# Patient Record
Sex: Female | Born: 1950 | Race: Black or African American | Hispanic: No | Marital: Married | Smoking: Never smoker
Health system: Southern US, Community
[De-identification: ages and names within clinical notes are randomized; demographics above are authoritative.]

## PROBLEM LIST (undated history)

## (undated) DIAGNOSIS — I10 Essential (primary) hypertension: Secondary | ICD-10-CM

## (undated) HISTORY — DX: Essential (primary) hypertension: I10

---

## 1997-08-02 ENCOUNTER — Emergency Department (HOSPITAL_COMMUNITY): Admission: EM | Admit: 1997-08-02 | Discharge: 1997-08-02 | Payer: Self-pay | Admitting: Emergency Medicine

## 1997-09-01 ENCOUNTER — Emergency Department (HOSPITAL_COMMUNITY): Admission: EM | Admit: 1997-09-01 | Discharge: 1997-09-01 | Payer: Self-pay | Admitting: Emergency Medicine

## 1997-09-05 ENCOUNTER — Encounter: Admission: RE | Admit: 1997-09-05 | Discharge: 1997-09-05 | Payer: Self-pay | Admitting: Family Medicine

## 1997-09-09 ENCOUNTER — Emergency Department (HOSPITAL_COMMUNITY): Admission: EM | Admit: 1997-09-09 | Discharge: 1997-09-09 | Payer: Self-pay | Admitting: Emergency Medicine

## 1997-09-14 ENCOUNTER — Encounter: Admission: RE | Admit: 1997-09-14 | Discharge: 1997-09-14 | Payer: Self-pay | Admitting: Family Medicine

## 1997-10-03 ENCOUNTER — Encounter: Admission: RE | Admit: 1997-10-03 | Discharge: 1997-10-03 | Payer: Self-pay | Admitting: Family Medicine

## 1997-10-15 ENCOUNTER — Encounter: Admission: RE | Admit: 1997-10-15 | Discharge: 1997-10-15 | Payer: Self-pay | Admitting: Family Medicine

## 1997-10-18 ENCOUNTER — Encounter (INDEPENDENT_AMBULATORY_CARE_PROVIDER_SITE_OTHER): Payer: Self-pay | Admitting: *Deleted

## 1997-11-07 ENCOUNTER — Encounter: Admission: RE | Admit: 1997-11-07 | Discharge: 1997-11-07 | Payer: Self-pay | Admitting: Family Medicine

## 1998-01-22 ENCOUNTER — Encounter: Admission: RE | Admit: 1998-01-22 | Discharge: 1998-01-22 | Payer: Self-pay | Admitting: Family Medicine

## 1998-03-21 ENCOUNTER — Encounter: Admission: RE | Admit: 1998-03-21 | Discharge: 1998-03-21 | Payer: Self-pay | Admitting: Family Medicine

## 1998-04-08 ENCOUNTER — Encounter: Admission: RE | Admit: 1998-04-08 | Discharge: 1998-04-08 | Payer: Self-pay | Admitting: Sports Medicine

## 1998-04-17 ENCOUNTER — Encounter: Admission: RE | Admit: 1998-04-17 | Discharge: 1998-04-17 | Payer: Self-pay | Admitting: Sports Medicine

## 1998-04-26 ENCOUNTER — Encounter: Admission: RE | Admit: 1998-04-26 | Discharge: 1998-04-26 | Payer: Self-pay | Admitting: Sports Medicine

## 1999-08-02 ENCOUNTER — Emergency Department (HOSPITAL_COMMUNITY): Admission: EM | Admit: 1999-08-02 | Discharge: 1999-08-02 | Payer: Self-pay | Admitting: Emergency Medicine

## 2003-05-15 ENCOUNTER — Emergency Department (HOSPITAL_COMMUNITY): Admission: EM | Admit: 2003-05-15 | Discharge: 2003-05-15 | Payer: Self-pay | Admitting: *Deleted

## 2004-02-27 ENCOUNTER — Other Ambulatory Visit: Admission: RE | Admit: 2004-02-27 | Discharge: 2004-02-27 | Payer: Self-pay | Admitting: Family Medicine

## 2004-05-24 ENCOUNTER — Emergency Department (HOSPITAL_COMMUNITY): Admission: EM | Admit: 2004-05-24 | Discharge: 2004-05-24 | Payer: Self-pay | Admitting: Emergency Medicine

## 2006-06-18 ENCOUNTER — Encounter (INDEPENDENT_AMBULATORY_CARE_PROVIDER_SITE_OTHER): Payer: Self-pay | Admitting: *Deleted

## 2008-09-20 ENCOUNTER — Emergency Department (HOSPITAL_COMMUNITY): Admission: EM | Admit: 2008-09-20 | Discharge: 2008-09-20 | Payer: Self-pay | Admitting: Emergency Medicine

## 2010-07-28 LAB — DIFFERENTIAL
Basophils Absolute: 0 10*3/uL (ref 0.0–0.1)
Basophils Relative: 1 % (ref 0–1)
Eosinophils Absolute: 0.1 10*3/uL (ref 0.0–0.7)
Eosinophils Relative: 2 % (ref 0–5)
Lymphocytes Relative: 34 % (ref 12–46)
Lymphs Abs: 1.8 10*3/uL (ref 0.7–4.0)
Monocytes Absolute: 0.4 10*3/uL (ref 0.1–1.0)
Monocytes Relative: 8 % (ref 3–12)
Neutro Abs: 2.9 10*3/uL (ref 1.7–7.7)
Neutrophils Relative %: 55 % (ref 43–77)

## 2010-07-28 LAB — COMPREHENSIVE METABOLIC PANEL
ALT: 15 U/L (ref 0–35)
AST: 21 U/L (ref 0–37)
Albumin: 3.7 g/dL (ref 3.5–5.2)
Alkaline Phosphatase: 94 U/L (ref 39–117)
BUN: 12 mg/dL (ref 6–23)
CO2: 31 mEq/L (ref 19–32)
Calcium: 10 mg/dL (ref 8.4–10.5)
Chloride: 102 mEq/L (ref 96–112)
Creatinine, Ser: 0.89 mg/dL (ref 0.4–1.2)
GFR calc Af Amer: >60 mL/min (ref >60)
GFR calc non Af Amer: >60 mL/min (ref >60)
Glucose, Bld: 109 mg/dL — ABNORMAL HIGH (ref 70–99)
Potassium: 3.1 mEq/L — ABNORMAL LOW (ref 3.5–5.1)
Sodium: 137 mEq/L (ref 135–145)
Total Bilirubin: 0.6 mg/dL (ref 0.3–1.2)
Total Protein: 8.4 g/dL — ABNORMAL HIGH (ref 6.0–8.3)

## 2010-07-28 LAB — CBC
HCT: 35 % — ABNORMAL LOW (ref 36.0–46.0)
Hemoglobin: 11.6 g/dL — ABNORMAL LOW (ref 12.0–15.0)
MCHC: 33.1 g/dL (ref 30.0–36.0)
MCV: 81.3 fL (ref 78.0–100.0)
Platelets: 211 10*3/uL (ref 150–400)
RBC: 4.31 MIL/uL (ref 3.87–5.11)
RDW: 14 % (ref 11.5–15.5)
WBC: 5.3 10*3/uL (ref 4.0–10.5)

## 2010-07-28 LAB — URINALYSIS, ROUTINE W REFLEX MICROSCOPIC
Bilirubin Urine: NEGATIVE
Glucose, UA: NEGATIVE mg/dL
Hgb urine dipstick: NEGATIVE
Ketones, ur: NEGATIVE mg/dL
Nitrite: NEGATIVE
Protein, ur: NEGATIVE mg/dL
Specific Gravity, Urine: 1.018 (ref 1.005–1.030)
Urobilinogen, UA: 0.2 mg/dL (ref 0.0–1.0)
pH: 5.5 (ref 5.0–8.0)

## 2010-07-28 LAB — URINE MICROSCOPIC-ADD ON

## 2013-03-13 ENCOUNTER — Other Ambulatory Visit (HOSPITAL_COMMUNITY)
Admission: RE | Admit: 2013-03-13 | Discharge: 2013-03-13 | Disposition: A | Discharge status: Home / Self Care | Payer: Self-pay | Source: Ambulatory Visit | Attending: Family Medicine | Admitting: Family Medicine

## 2013-03-13 ENCOUNTER — Other Ambulatory Visit: Payer: Self-pay | Admitting: Family Medicine

## 2013-03-13 DIAGNOSIS — Z124 Encounter for screening for malignant neoplasm of cervix: Secondary | ICD-10-CM | POA: Insufficient documentation

## 2015-08-07 ENCOUNTER — Other Ambulatory Visit: Payer: Self-pay

## 2015-08-07 DIAGNOSIS — E7800 Pure hypercholesterolemia, unspecified: Secondary | ICD-10-CM | POA: Diagnosis not present

## 2015-08-07 DIAGNOSIS — Z0000 Encounter for general adult medical examination without abnormal findings: Secondary | ICD-10-CM | POA: Diagnosis not present

## 2015-08-07 DIAGNOSIS — B351 Tinea unguium: Secondary | ICD-10-CM | POA: Diagnosis not present

## 2015-08-07 DIAGNOSIS — D509 Iron deficiency anemia, unspecified: Secondary | ICD-10-CM | POA: Diagnosis not present

## 2015-08-07 DIAGNOSIS — Z01419 Encounter for gynecological examination (general) (routine) without abnormal findings: Secondary | ICD-10-CM | POA: Diagnosis not present

## 2015-08-07 DIAGNOSIS — Z23 Encounter for immunization: Secondary | ICD-10-CM | POA: Diagnosis not present

## 2015-08-07 DIAGNOSIS — H539 Unspecified visual disturbance: Secondary | ICD-10-CM | POA: Diagnosis not present

## 2015-08-07 DIAGNOSIS — I10 Essential (primary) hypertension: Secondary | ICD-10-CM | POA: Diagnosis not present

## 2015-08-07 DIAGNOSIS — Z1231 Encounter for screening mammogram for malignant neoplasm of breast: Secondary | ICD-10-CM

## 2015-08-21 ENCOUNTER — Ambulatory Visit
Admission: RE | Admit: 2015-08-21 | Discharge: 2015-08-21 | Disposition: A | Discharge status: Home / Self Care | Payer: Medicare Other | Source: Ambulatory Visit

## 2015-08-21 DIAGNOSIS — Z1231 Encounter for screening mammogram for malignant neoplasm of breast: Secondary | ICD-10-CM | POA: Diagnosis not present

## 2015-08-28 ENCOUNTER — Ambulatory Visit (INDEPENDENT_AMBULATORY_CARE_PROVIDER_SITE_OTHER): Payer: Medicare Other | Admitting: Podiatry

## 2015-08-28 ENCOUNTER — Encounter: Payer: Self-pay | Admitting: Podiatry

## 2015-08-28 DIAGNOSIS — M79675 Pain in left toe(s): Secondary | ICD-10-CM | POA: Diagnosis not present

## 2015-08-28 DIAGNOSIS — B351 Tinea unguium: Secondary | ICD-10-CM | POA: Diagnosis not present

## 2015-08-28 DIAGNOSIS — M79674 Pain in right toe(s): Secondary | ICD-10-CM | POA: Diagnosis not present

## 2015-08-28 NOTE — Progress Notes (Signed)
   Subjective:    Patient ID: Pamela Conner, female    DOB: 1950/05/11, 65 y.o.   MRN: [id number]  HPI    This patient presents today complaining of thickened and elongated and discolored toenails on the right and left feet with particular concern about the great toenails. She describes the symptoms occurring over a long period of time with gradual increase in thickness and color changes. As a result of the deformity in these toenails and some complaint of discomfort when walking wearing shoes she is requesting toenail debridement today. She denies any recent podiatric care or professional care for this problem     Review of Systems  Skin: Positive for color change.       Objective:   Physical Exam  Orientated 3  Vascular: No peripheral edema bilaterally DP pulses 2/4 bilaterally PT pulses 2/4 bilaterally Capillary reflex immediate bilaterally  Neurological: Sensation to 10 g monofilament wire intact 4/5 bilaterally Vibratory sensation reactive bilaterally Ankle reflex equal and reactive bilaterally  Dermatological: Open skin lesions bilaterally The toenails are elongated, discolored, hypertrophic with maximum texture and color changes in the hallux toenails bilaterally and palpable tenderness in toenails 6-10  Musculoskeletal: No deformities noted bilaterally There is no restriction ankle, subtalar, midtarsal joints bilaterally      Assessment & Plan:   Assessment: 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 Patient opting for debridement. The toenails 6-10 were debrided mechanically and electrically without any bleeding  Reappoint at patient's request

## 2015-08-28 NOTE — Patient Instructions (Signed)
Your foot examination demonstrated adequate feeling in pulsations in your feet. Today I trim the toenails most likely associated with fungal infection. Return as needed or every 3-4 months

## 2015-09-04 DIAGNOSIS — M8589 Other specified disorders of bone density and structure, multiple sites: Secondary | ICD-10-CM | POA: Diagnosis not present

## 2015-09-04 DIAGNOSIS — Z780 Asymptomatic menopausal state: Secondary | ICD-10-CM | POA: Diagnosis not present

## 2016-02-05 DIAGNOSIS — R7303 Prediabetes: Secondary | ICD-10-CM | POA: Diagnosis not present

## 2016-02-05 DIAGNOSIS — I10 Essential (primary) hypertension: Secondary | ICD-10-CM | POA: Diagnosis not present

## 2016-02-05 DIAGNOSIS — E7800 Pure hypercholesterolemia, unspecified: Secondary | ICD-10-CM | POA: Diagnosis not present

## 2016-11-03 DIAGNOSIS — I10 Essential (primary) hypertension: Secondary | ICD-10-CM | POA: Diagnosis not present

## 2016-11-03 DIAGNOSIS — Z23 Encounter for immunization: Secondary | ICD-10-CM | POA: Diagnosis not present

## 2016-11-03 DIAGNOSIS — Z0000 Encounter for general adult medical examination without abnormal findings: Secondary | ICD-10-CM | POA: Diagnosis not present

## 2016-11-03 DIAGNOSIS — Z1389 Encounter for screening for other disorder: Secondary | ICD-10-CM | POA: Diagnosis not present

## 2016-11-03 DIAGNOSIS — D509 Iron deficiency anemia, unspecified: Secondary | ICD-10-CM | POA: Diagnosis not present

## 2016-11-03 DIAGNOSIS — E7800 Pure hypercholesterolemia, unspecified: Secondary | ICD-10-CM | POA: Diagnosis not present

## 2016-11-03 DIAGNOSIS — R7303 Prediabetes: Secondary | ICD-10-CM | POA: Diagnosis not present

## 2017-12-01 DIAGNOSIS — I10 Essential (primary) hypertension: Secondary | ICD-10-CM | POA: Diagnosis not present

## 2017-12-01 DIAGNOSIS — E7800 Pure hypercholesterolemia, unspecified: Secondary | ICD-10-CM | POA: Diagnosis not present

## 2017-12-01 DIAGNOSIS — E669 Obesity, unspecified: Secondary | ICD-10-CM | POA: Diagnosis not present

## 2017-12-01 DIAGNOSIS — Z1211 Encounter for screening for malignant neoplasm of colon: Secondary | ICD-10-CM | POA: Diagnosis not present

## 2017-12-01 DIAGNOSIS — R7309 Other abnormal glucose: Secondary | ICD-10-CM | POA: Diagnosis not present

## 2018-01-18 ENCOUNTER — Encounter: Payer: Self-pay | Admitting: Podiatry

## 2018-01-18 ENCOUNTER — Ambulatory Visit (INDEPENDENT_AMBULATORY_CARE_PROVIDER_SITE_OTHER): Payer: Medicare Other | Admitting: Podiatry

## 2018-01-18 DIAGNOSIS — M79674 Pain in right toe(s): Secondary | ICD-10-CM | POA: Diagnosis not present

## 2018-01-18 DIAGNOSIS — B351 Tinea unguium: Secondary | ICD-10-CM | POA: Diagnosis not present

## 2018-01-18 DIAGNOSIS — M79675 Pain in left toe(s): Secondary | ICD-10-CM | POA: Diagnosis not present

## 2018-01-18 NOTE — Progress Notes (Signed)
Complaint:  Visit Type: Patient returns to my office for continued preventative foot care services. Complaint: Patient states" my nails have grown long and thick and become painful to walk and wear shoes"  The patient presents for preventative foot care services. No changes to ROS  Podiatric Exam: Vascular: dorsalis pedis and posterior tibial pulses are palpable bilateral. Capillary return is immediate. Temperature gradient is WNL. Skin turgor WNL  Sensorium: Normal Semmes Weinstein monofilament test. Normal tactile sensation bilaterally. Nail Exam: Pt has thick disfigured discolored nails with subungual debris noted bilateral entire nail hallux through fifth toenails Ulcer Exam: There is no evidence of ulcer or pre-ulcerative changes or infection. Orthopedic Exam: Muscle tone and strength are WNL. No limitations in general ROM. No crepitus or effusions noted. Foot type and digits show no abnormalities. Bony prominences are unremarkable. Skin: No Porokeratosis. No infection or ulcers  Diagnosis:  Onychomycosis, , Pain in right toe, pain in left toes  Treatment & Plan Procedures and Treatment: Consent by patient was obtained for treatment procedures.   Debridement of mycotic and hypertrophic toenails, 1 through 5 bilateral and clearing of subungual debris. No ulceration, no infection noted.  Return Visit-Office Procedure: Patient instructed to return to the office for a follow up visit prn  for continued evaluation and treatment.    Arnell Slivinski DPM 

## 2018-03-20 ENCOUNTER — Encounter: Payer: Self-pay | Admitting: *Deleted

## 2018-08-22 NOTE — [facility]regational Nurse Program (Signed)
[id number]/tct-patient see note for information

## 2018-09-27 ENCOUNTER — Encounter: Payer: Self-pay | Admitting: Podiatry

## 2018-09-27 ENCOUNTER — Ambulatory Visit (INDEPENDENT_AMBULATORY_CARE_PROVIDER_SITE_OTHER): Payer: Medicare Other | Admitting: Podiatry

## 2018-09-27 ENCOUNTER — Other Ambulatory Visit: Payer: Self-pay

## 2018-09-27 VITALS — Temp 97.9°F

## 2018-09-27 DIAGNOSIS — M79674 Pain in right toe(s): Secondary | ICD-10-CM

## 2018-09-27 DIAGNOSIS — M79675 Pain in left toe(s): Secondary | ICD-10-CM | POA: Diagnosis not present

## 2018-09-27 DIAGNOSIS — B351 Tinea unguium: Secondary | ICD-10-CM | POA: Diagnosis not present

## 2018-09-27 NOTE — Progress Notes (Signed)
Complaint:  Visit Type: Patient returns to my office for continued preventative foot care services. Complaint: Patient states" my nails have grown long and thick and become painful to walk and wear shoes"  The patient presents for preventative foot care services. No changes to ROS  Podiatric Exam: Vascular: dorsalis pedis and posterior tibial pulses are palpable bilateral. Capillary return is immediate. Temperature gradient is WNL. Skin turgor WNL  Sensorium: Normal Semmes Weinstein monofilament test. Normal tactile sensation bilaterally. Nail Exam: Pt has thick disfigured discolored nails with subungual debris noted bilateral entire nail hallux through fifth toenails Ulcer Exam: There is no evidence of ulcer or pre-ulcerative changes or infection. Orthopedic Exam: Muscle tone and strength are WNL. No limitations in general ROM. No crepitus or effusions noted. Foot type and digits show no abnormalities. Bony prominences are unremarkable. Skin: No Porokeratosis. No infection or ulcers  Diagnosis:  Onychomycosis, , Pain in right toe, pain in left toes  Treatment & Plan Procedures and Treatment: Consent by patient was obtained for treatment procedures.   Debridement of mycotic and hypertrophic toenails, 1 through 5 bilateral and clearing of subungual debris. No ulceration, no infection noted.  Return Visit-Office Procedure: Patient instructed to return to the office for a follow up visit prn  for continued evaluation and treatment.    Shaylynn Nulty DPM 

## 2019-01-31 ENCOUNTER — Ambulatory Visit (INDEPENDENT_AMBULATORY_CARE_PROVIDER_SITE_OTHER): Payer: Medicare Other | Admitting: Podiatry

## 2019-01-31 ENCOUNTER — Encounter: Payer: Self-pay | Admitting: Podiatry

## 2019-01-31 ENCOUNTER — Other Ambulatory Visit: Payer: Self-pay

## 2019-01-31 DIAGNOSIS — B351 Tinea unguium: Secondary | ICD-10-CM

## 2019-01-31 DIAGNOSIS — M79675 Pain in left toe(s): Secondary | ICD-10-CM

## 2019-01-31 DIAGNOSIS — M79674 Pain in right toe(s): Secondary | ICD-10-CM | POA: Diagnosis not present

## 2019-01-31 NOTE — Progress Notes (Signed)
Complaint:  Visit Type: Patient returns to my office for continued preventative foot care services. Complaint: Patient states" my nails have grown long and thick and become painful to walk and wear shoes"  The patient presents for preventative foot care services. No changes to ROS  Podiatric Exam: Vascular: dorsalis pedis and posterior tibial pulses are palpable bilateral. Capillary return is immediate. Temperature gradient is WNL. Skin turgor WNL  Sensorium: Normal Semmes Weinstein monofilament test. Normal tactile sensation bilaterally. Nail Exam: Pt has thick disfigured discolored nails with subungual debris noted bilateral entire nail hallux through fifth toenails Ulcer Exam: There is no evidence of ulcer or pre-ulcerative changes or infection. Orthopedic Exam: Muscle tone and strength are WNL. No limitations in general ROM. No crepitus or effusions noted. Foot type and digits show no abnormalities. Bony prominences are unremarkable. Skin: No Porokeratosis. No infection or ulcers  Diagnosis:  Onychomycosis, , Pain in right toe, pain in left toes  Treatment & Plan Procedures and Treatment: Consent by patient was obtained for treatment procedures.   Debridement of mycotic and hypertrophic toenails, 1 through 5 bilateral and clearing of subungual debris. No ulceration, no infection noted.  Return Visit-Office Procedure: Patient instructed to return to the office for a follow up visit 4 months for continued evaluation and treatment.    Cacey Willow DPM 

## 2019-02-15 ENCOUNTER — Other Ambulatory Visit: Payer: Self-pay | Admitting: Family Medicine

## 2019-02-15 DIAGNOSIS — D509 Iron deficiency anemia, unspecified: Secondary | ICD-10-CM | POA: Diagnosis not present

## 2019-02-15 DIAGNOSIS — R7303 Prediabetes: Secondary | ICD-10-CM | POA: Diagnosis not present

## 2019-02-15 DIAGNOSIS — Z1231 Encounter for screening mammogram for malignant neoplasm of breast: Secondary | ICD-10-CM

## 2019-02-15 DIAGNOSIS — I10 Essential (primary) hypertension: Secondary | ICD-10-CM | POA: Diagnosis not present

## 2019-02-15 DIAGNOSIS — E7800 Pure hypercholesterolemia, unspecified: Secondary | ICD-10-CM | POA: Diagnosis not present

## 2019-02-15 DIAGNOSIS — Z1389 Encounter for screening for other disorder: Secondary | ICD-10-CM | POA: Diagnosis not present

## 2019-02-15 DIAGNOSIS — E669 Obesity, unspecified: Secondary | ICD-10-CM | POA: Diagnosis not present

## 2019-02-15 DIAGNOSIS — Z0000 Encounter for general adult medical examination without abnormal findings: Secondary | ICD-10-CM | POA: Diagnosis not present

## 2019-02-15 DIAGNOSIS — M8589 Other specified disorders of bone density and structure, multiple sites: Secondary | ICD-10-CM

## 2019-05-12 ENCOUNTER — Other Ambulatory Visit: Payer: Self-pay

## 2019-05-12 ENCOUNTER — Encounter (INDEPENDENT_AMBULATORY_CARE_PROVIDER_SITE_OTHER): Payer: Self-pay

## 2019-05-12 ENCOUNTER — Ambulatory Visit
Admission: RE | Admit: 2019-05-12 | Discharge: 2019-05-12 | Disposition: A | Discharge status: Home / Self Care | Payer: Medicare Other | Source: Ambulatory Visit | Attending: Family Medicine | Admitting: Family Medicine

## 2019-05-12 ENCOUNTER — Ambulatory Visit
Admission: RE | Admit: 2019-05-12 | Discharge: 2019-05-12 | Disposition: A | Discharge status: Home / Self Care | Payer: Medicare HMO | Source: Ambulatory Visit | Attending: Family Medicine | Admitting: Family Medicine

## 2019-05-12 DIAGNOSIS — Z1231 Encounter for screening mammogram for malignant neoplasm of breast: Secondary | ICD-10-CM | POA: Diagnosis not present

## 2019-05-12 DIAGNOSIS — M8589 Other specified disorders of bone density and structure, multiple sites: Secondary | ICD-10-CM | POA: Diagnosis not present

## 2019-05-12 DIAGNOSIS — Z780 Asymptomatic menopausal state: Secondary | ICD-10-CM | POA: Diagnosis not present

## 2019-06-06 ENCOUNTER — Other Ambulatory Visit: Payer: Self-pay

## 2019-06-06 ENCOUNTER — Telehealth: Payer: Self-pay | Admitting: Podiatry

## 2019-06-06 ENCOUNTER — Ambulatory Visit (INDEPENDENT_AMBULATORY_CARE_PROVIDER_SITE_OTHER): Payer: Medicare HMO | Admitting: Podiatry

## 2019-06-06 ENCOUNTER — Encounter: Payer: Self-pay | Admitting: Podiatry

## 2019-06-06 DIAGNOSIS — M79675 Pain in left toe(s): Secondary | ICD-10-CM | POA: Diagnosis not present

## 2019-06-06 DIAGNOSIS — B351 Tinea unguium: Secondary | ICD-10-CM | POA: Diagnosis not present

## 2019-06-06 DIAGNOSIS — M79674 Pain in right toe(s): Secondary | ICD-10-CM

## 2019-06-06 NOTE — Telephone Encounter (Signed)
Pt requesting an itemized statement for her visit on 06/06/2019. Please give patient a call when ready and she will come by to pick up.

## 2019-06-06 NOTE — Progress Notes (Signed)
Complaint:  Visit Type: Patient returns to my office for continued preventative foot care services. Complaint: Patient states" my nails have grown long and thick and become painful to walk and wear shoes"  The patient presents for preventative foot care services. No changes to ROS  Podiatric Exam: Vascular: dorsalis pedis and posterior tibial pulses are palpable bilateral. Capillary return is immediate. Temperature gradient is WNL. Skin turgor WNL  Sensorium: Normal Semmes Weinstein monofilament test. Normal tactile sensation bilaterally. Nail Exam: Pt has thick disfigured discolored nails with subungual debris noted bilateral entire nail hallux through fifth toenails Ulcer Exam: There is no evidence of ulcer or pre-ulcerative changes or infection. Orthopedic Exam: Muscle tone and strength are WNL. No limitations in general ROM. No crepitus or effusions noted. Foot type and digits show no abnormalities. Bony prominences are unremarkable. Skin: No Porokeratosis. No infection or ulcers  Diagnosis:  Onychomycosis, , Pain in right toe, pain in left toes  Treatment & Plan Procedures and Treatment: Consent by patient was obtained for treatment procedures.   Debridement of mycotic and hypertrophic toenails, 1 through 5 bilateral and clearing of subungual debris. No ulceration, no infection noted.  Return Visit-Office Procedure: Patient instructed to return to the office for a follow up visit 4 months for continued evaluation and treatment.    Lavern Maslow DPM 

## 2019-08-02 DIAGNOSIS — H524 Presbyopia: Secondary | ICD-10-CM | POA: Diagnosis not present

## 2019-08-02 DIAGNOSIS — Z0100 Encounter for examination of eyes and vision without abnormal findings: Secondary | ICD-10-CM | POA: Diagnosis not present

## 2019-08-03 DIAGNOSIS — Z20828 Contact with and (suspected) exposure to other viral communicable diseases: Secondary | ICD-10-CM | POA: Diagnosis not present

## 2019-08-03 DIAGNOSIS — Z03818 Encounter for observation for suspected exposure to other biological agents ruled out: Secondary | ICD-10-CM | POA: Diagnosis not present

## 2019-08-17 DIAGNOSIS — D509 Iron deficiency anemia, unspecified: Secondary | ICD-10-CM | POA: Diagnosis not present

## 2019-08-17 DIAGNOSIS — R7303 Prediabetes: Secondary | ICD-10-CM | POA: Diagnosis not present

## 2019-08-17 DIAGNOSIS — E669 Obesity, unspecified: Secondary | ICD-10-CM | POA: Diagnosis not present

## 2019-08-17 DIAGNOSIS — I10 Essential (primary) hypertension: Secondary | ICD-10-CM | POA: Diagnosis not present

## 2019-08-17 DIAGNOSIS — E7800 Pure hypercholesterolemia, unspecified: Secondary | ICD-10-CM | POA: Diagnosis not present

## 2019-10-04 ENCOUNTER — Encounter: Payer: Self-pay | Admitting: Podiatry

## 2019-10-04 ENCOUNTER — Ambulatory Visit: Payer: Medicare HMO | Admitting: Podiatry

## 2019-10-04 ENCOUNTER — Other Ambulatory Visit: Payer: Self-pay

## 2019-10-04 DIAGNOSIS — M79675 Pain in left toe(s): Secondary | ICD-10-CM

## 2019-10-04 DIAGNOSIS — B351 Tinea unguium: Secondary | ICD-10-CM | POA: Diagnosis not present

## 2019-10-04 DIAGNOSIS — M79674 Pain in right toe(s): Secondary | ICD-10-CM | POA: Diagnosis not present

## 2019-10-04 NOTE — Progress Notes (Signed)

## 2020-01-24 DIAGNOSIS — H40013 Open angle with borderline findings, low risk, bilateral: Secondary | ICD-10-CM | POA: Diagnosis not present

## 2020-02-07 ENCOUNTER — Ambulatory Visit: Payer: Medicare HMO | Admitting: Podiatry

## 2020-02-07 ENCOUNTER — Other Ambulatory Visit: Payer: Self-pay

## 2020-02-07 ENCOUNTER — Encounter: Payer: Self-pay | Admitting: Podiatry

## 2020-02-07 DIAGNOSIS — M79674 Pain in right toe(s): Secondary | ICD-10-CM

## 2020-02-07 DIAGNOSIS — M79675 Pain in left toe(s): Secondary | ICD-10-CM | POA: Diagnosis not present

## 2020-02-07 DIAGNOSIS — B351 Tinea unguium: Secondary | ICD-10-CM | POA: Diagnosis not present

## 2020-02-07 NOTE — Progress Notes (Signed)

## 2020-02-28 DIAGNOSIS — H401131 Primary open-angle glaucoma, bilateral, mild stage: Secondary | ICD-10-CM | POA: Diagnosis not present

## 2020-02-28 DIAGNOSIS — Z0100 Encounter for examination of eyes and vision without abnormal findings: Secondary | ICD-10-CM | POA: Diagnosis not present

## 2020-03-27 DIAGNOSIS — H401131 Primary open-angle glaucoma, bilateral, mild stage: Secondary | ICD-10-CM | POA: Diagnosis not present

## 2020-04-10 DIAGNOSIS — D509 Iron deficiency anemia, unspecified: Secondary | ICD-10-CM | POA: Diagnosis not present

## 2020-04-10 DIAGNOSIS — E669 Obesity, unspecified: Secondary | ICD-10-CM | POA: Diagnosis not present

## 2020-04-10 DIAGNOSIS — I10 Essential (primary) hypertension: Secondary | ICD-10-CM | POA: Diagnosis not present

## 2020-04-10 DIAGNOSIS — Z0000 Encounter for general adult medical examination without abnormal findings: Secondary | ICD-10-CM | POA: Diagnosis not present

## 2020-04-10 DIAGNOSIS — R7303 Prediabetes: Secondary | ICD-10-CM | POA: Diagnosis not present

## 2020-04-10 DIAGNOSIS — E7800 Pure hypercholesterolemia, unspecified: Secondary | ICD-10-CM | POA: Diagnosis not present

## 2020-04-24 DIAGNOSIS — H401131 Primary open-angle glaucoma, bilateral, mild stage: Secondary | ICD-10-CM | POA: Diagnosis not present

## 2020-06-12 ENCOUNTER — Ambulatory Visit: Payer: Medicare HMO | Admitting: Podiatry

## 2020-10-09 DIAGNOSIS — Z0100 Encounter for examination of eyes and vision without abnormal findings: Secondary | ICD-10-CM | POA: Diagnosis not present

## 2020-10-09 DIAGNOSIS — H5200 Hypermetropia, unspecified eye: Secondary | ICD-10-CM | POA: Diagnosis not present

## 2020-10-09 DIAGNOSIS — H40009 Preglaucoma, unspecified, unspecified eye: Secondary | ICD-10-CM | POA: Diagnosis not present

## 2020-10-16 DIAGNOSIS — H52223 Regular astigmatism, bilateral: Secondary | ICD-10-CM | POA: Diagnosis not present

## 2020-10-16 DIAGNOSIS — H524 Presbyopia: Secondary | ICD-10-CM | POA: Diagnosis not present

## 2020-11-06 DIAGNOSIS — E7800 Pure hypercholesterolemia, unspecified: Secondary | ICD-10-CM | POA: Diagnosis not present

## 2020-11-06 DIAGNOSIS — R7303 Prediabetes: Secondary | ICD-10-CM | POA: Diagnosis not present

## 2020-11-06 DIAGNOSIS — D509 Iron deficiency anemia, unspecified: Secondary | ICD-10-CM | POA: Diagnosis not present

## 2020-11-06 DIAGNOSIS — I10 Essential (primary) hypertension: Secondary | ICD-10-CM | POA: Diagnosis not present

## 2020-11-19 ENCOUNTER — Other Ambulatory Visit: Payer: Self-pay

## 2020-11-19 ENCOUNTER — Encounter: Payer: Self-pay | Admitting: Podiatry

## 2020-11-19 ENCOUNTER — Ambulatory Visit (INDEPENDENT_AMBULATORY_CARE_PROVIDER_SITE_OTHER): Payer: Medicare HMO | Admitting: Podiatry

## 2020-11-19 DIAGNOSIS — M79674 Pain in right toe(s): Secondary | ICD-10-CM

## 2020-11-19 DIAGNOSIS — B351 Tinea unguium: Secondary | ICD-10-CM | POA: Diagnosis not present

## 2020-11-19 DIAGNOSIS — M79675 Pain in left toe(s): Secondary | ICD-10-CM

## 2020-11-19 NOTE — Progress Notes (Signed)

## 2021-03-26 ENCOUNTER — Ambulatory Visit: Payer: Medicare HMO | Admitting: Podiatry

## 2021-05-07 DIAGNOSIS — E7800 Pure hypercholesterolemia, unspecified: Secondary | ICD-10-CM | POA: Diagnosis not present

## 2021-05-07 DIAGNOSIS — I10 Essential (primary) hypertension: Secondary | ICD-10-CM | POA: Diagnosis not present

## 2021-05-07 DIAGNOSIS — E669 Obesity, unspecified: Secondary | ICD-10-CM | POA: Diagnosis not present

## 2021-05-07 DIAGNOSIS — D509 Iron deficiency anemia, unspecified: Secondary | ICD-10-CM | POA: Diagnosis not present

## 2021-05-07 DIAGNOSIS — R7303 Prediabetes: Secondary | ICD-10-CM | POA: Diagnosis not present

## 2021-05-07 DIAGNOSIS — Z0000 Encounter for general adult medical examination without abnormal findings: Secondary | ICD-10-CM | POA: Diagnosis not present

## 2021-05-07 DIAGNOSIS — Z1389 Encounter for screening for other disorder: Secondary | ICD-10-CM | POA: Diagnosis not present

## 2021-05-07 DIAGNOSIS — Z1159 Encounter for screening for other viral diseases: Secondary | ICD-10-CM | POA: Diagnosis not present

## 2021-09-11 DIAGNOSIS — H1012 Acute atopic conjunctivitis, left eye: Secondary | ICD-10-CM | POA: Diagnosis not present

## 2021-09-11 DIAGNOSIS — H4089 Other specified glaucoma: Secondary | ICD-10-CM | POA: Diagnosis not present

## 2021-09-18 DIAGNOSIS — H1030 Unspecified acute conjunctivitis, unspecified eye: Secondary | ICD-10-CM | POA: Diagnosis not present

## 2021-10-05 IMAGING — MG DIGITAL SCREENING BILAT W/ TOMO W/ CAD
6 of 10 series · 6 of 30 positions shown · non-contrast
Comparison: Previous exam(s).

CLINICAL DATA: Screening.

EXAM:
DIGITAL SCREENING BILATERAL MAMMOGRAM WITH TOMO AND CAD

[R MLO synth-2D]
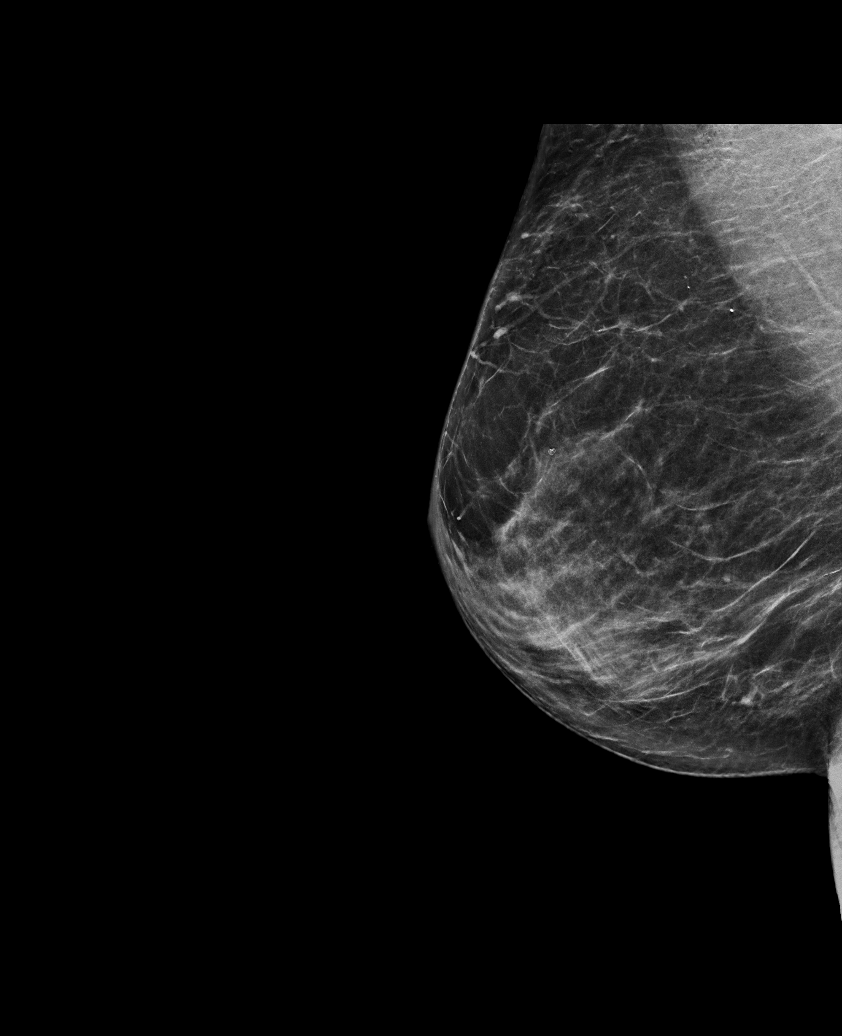

[R CC synth-2D (1 of 2)]
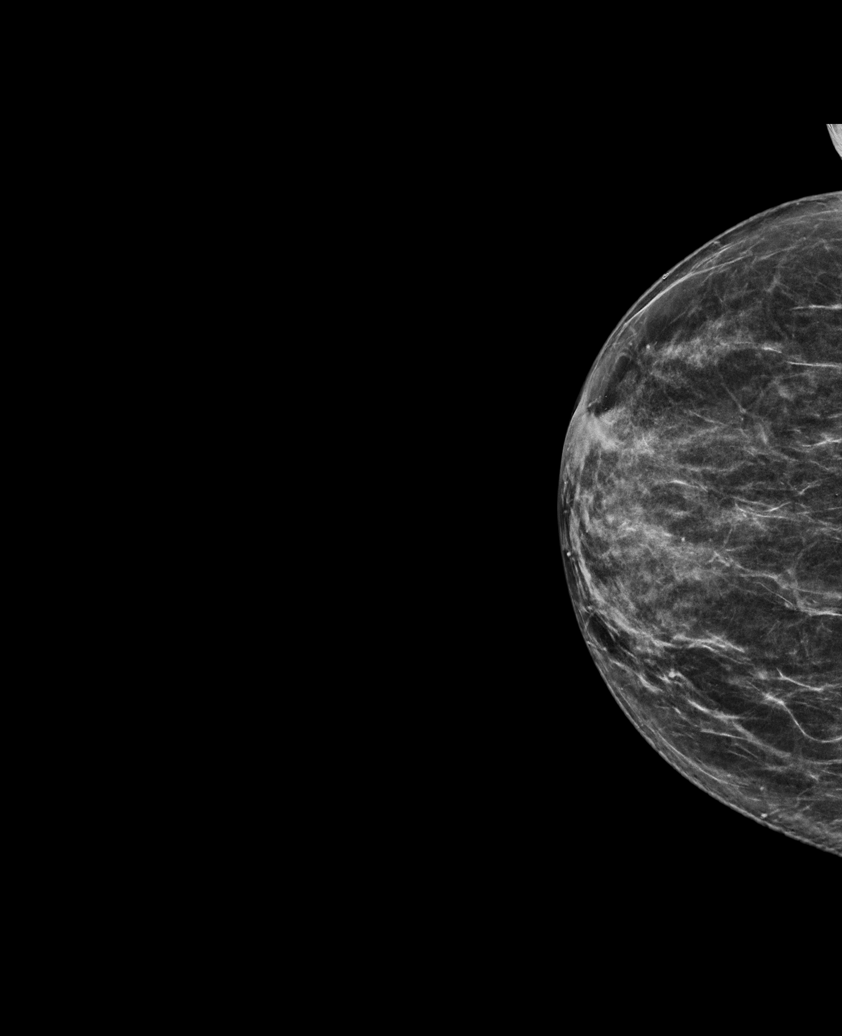

[L MLO synth-2D]
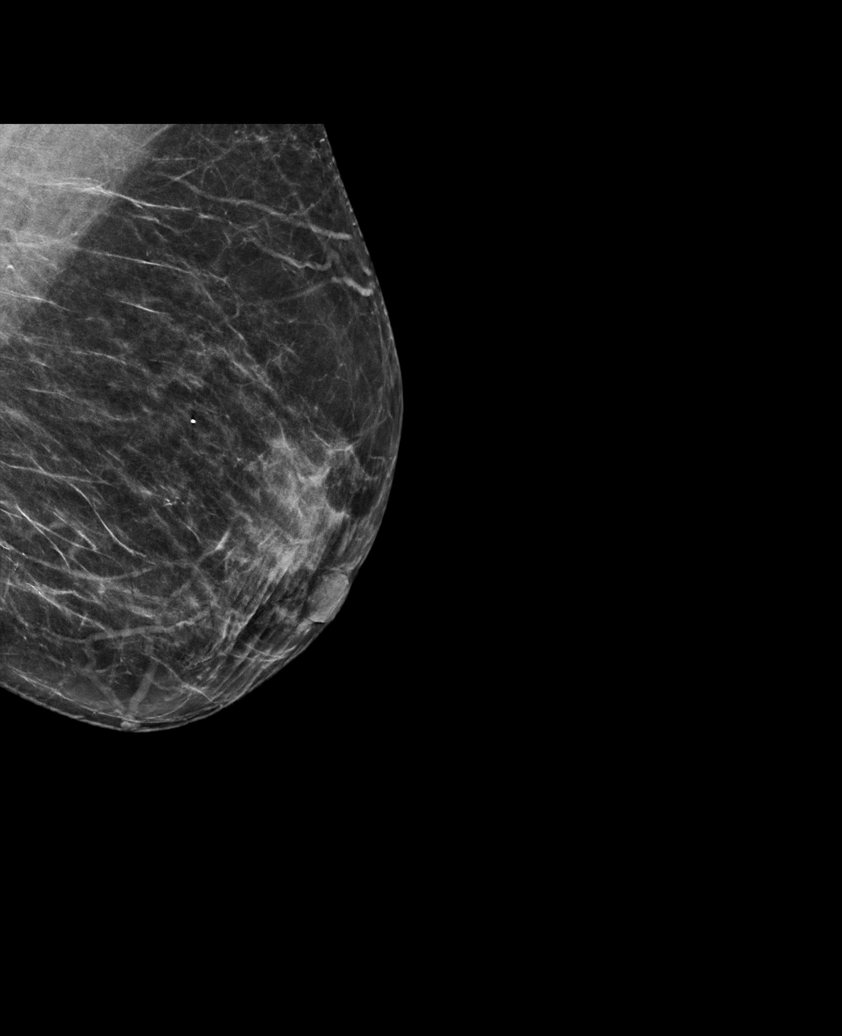

[L CC synth-2D]
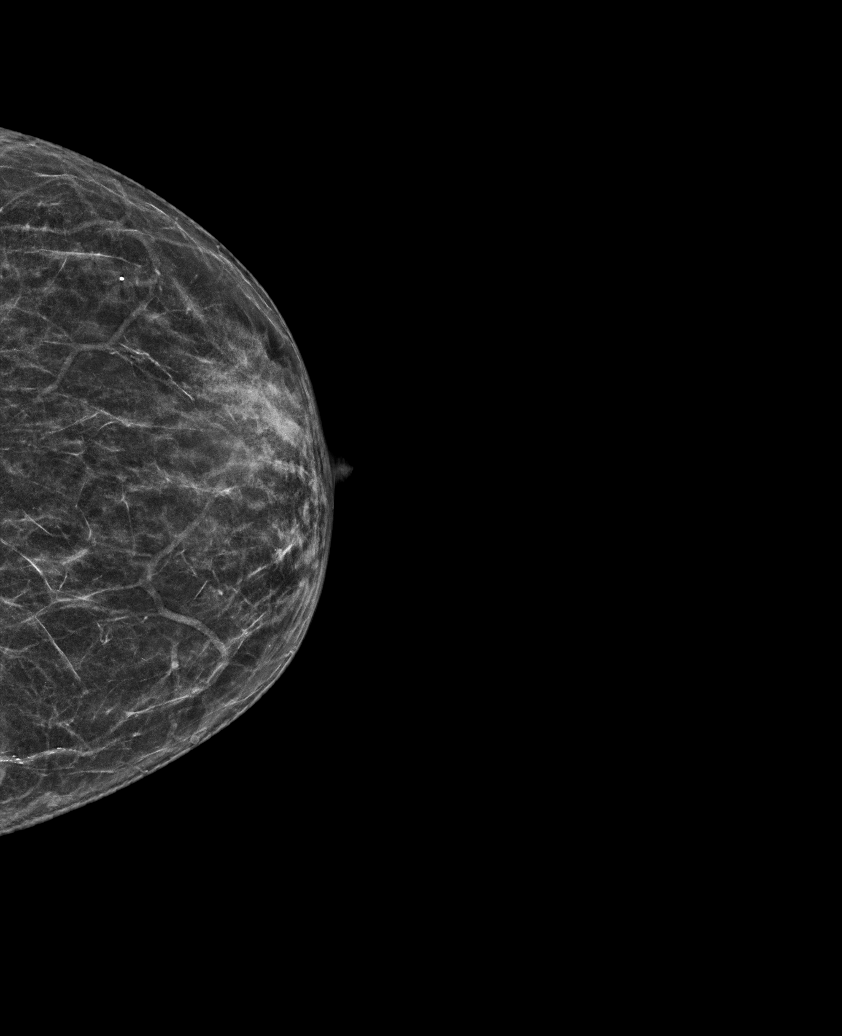

[R CC synth-2D (2 of 2)]
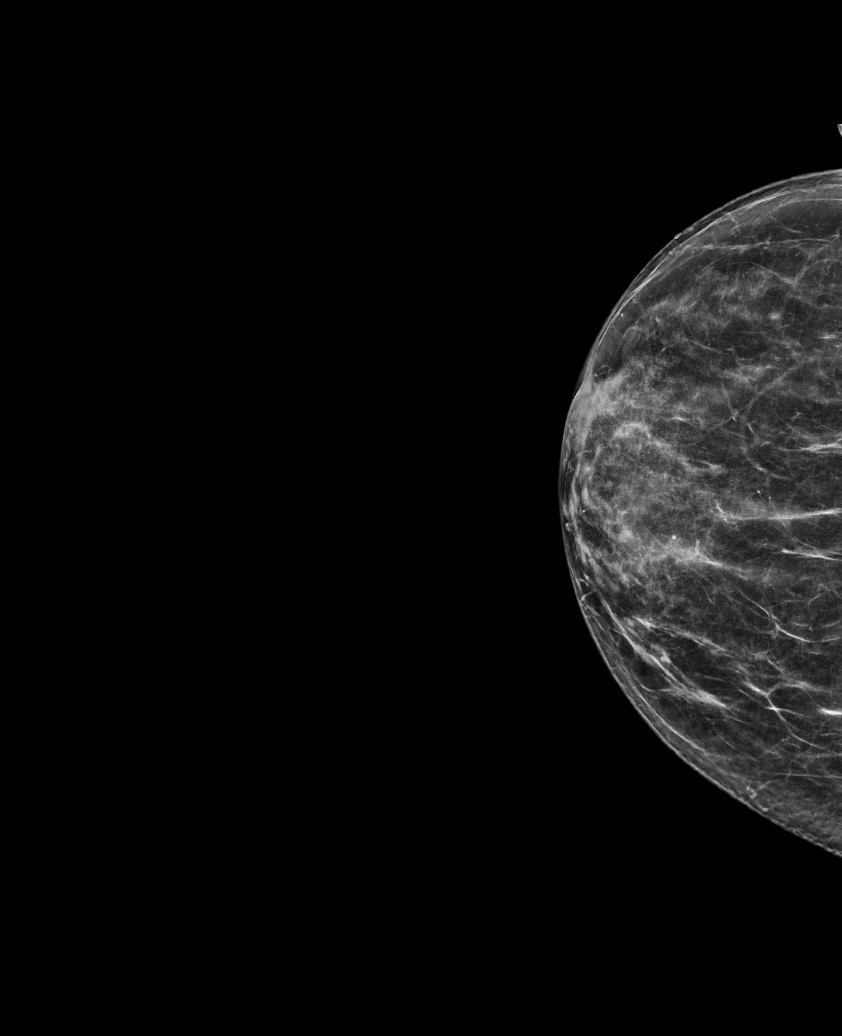

[L CC tomo · tomo slice 29/56.0]
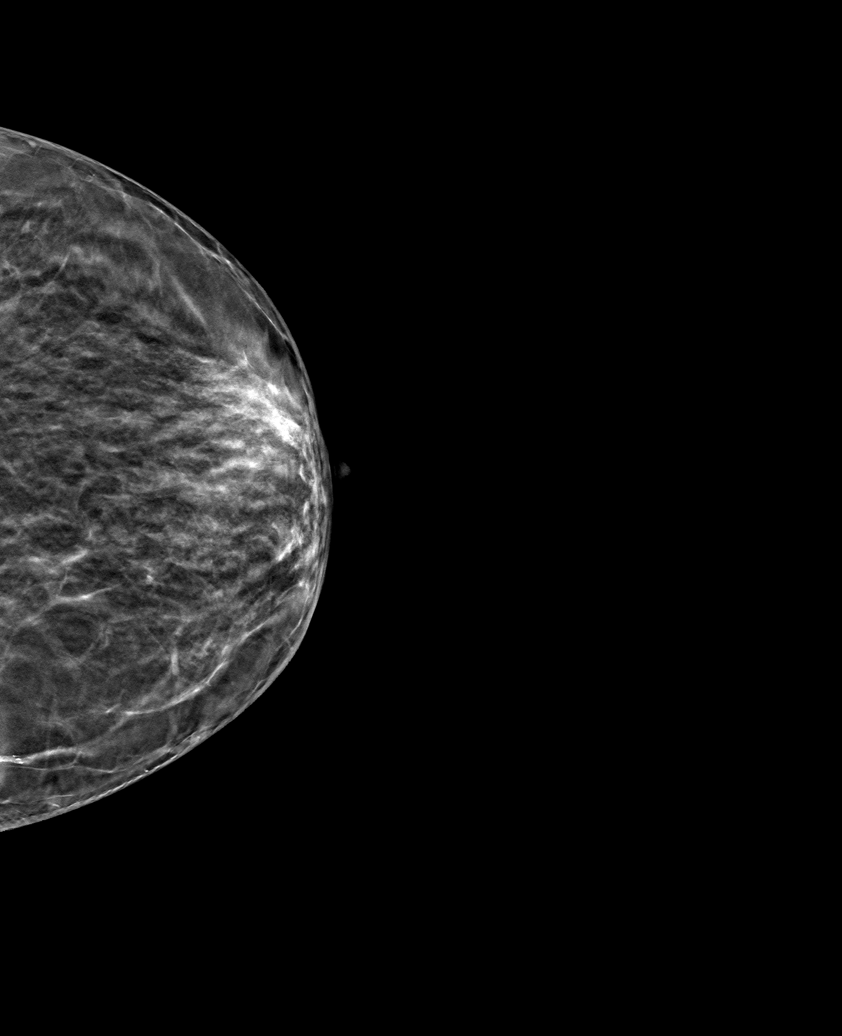

[6 of 30 positions shown; findings below may reference images not displayed]

ACR Breast Density Category c: The breast tissue is heterogeneously
dense, which may obscure small masses.
FINDINGS: There are no findings suspicious for malignancy. Images were
processed with CAD.
IMPRESSION: No mammographic evidence of malignancy. A result letter of this
screening mammogram will be mailed directly to the patient.

RECOMMENDATION:
Screening mammogram in one year. (Code:FT-U-LHB)

BI-RADS CATEGORY  1: Negative.

## 2021-10-08 ENCOUNTER — Encounter: Payer: Self-pay | Admitting: Podiatry

## 2021-10-08 ENCOUNTER — Ambulatory Visit: Payer: Medicare HMO | Admitting: Podiatry

## 2021-10-08 DIAGNOSIS — M79675 Pain in left toe(s): Secondary | ICD-10-CM

## 2021-10-08 DIAGNOSIS — B351 Tinea unguium: Secondary | ICD-10-CM | POA: Diagnosis not present

## 2021-10-08 DIAGNOSIS — M79674 Pain in right toe(s): Secondary | ICD-10-CM

## 2021-10-08 NOTE — Progress Notes (Signed)

## 2021-11-05 DIAGNOSIS — H4089 Other specified glaucoma: Secondary | ICD-10-CM | POA: Diagnosis not present

## 2021-11-05 DIAGNOSIS — R7303 Prediabetes: Secondary | ICD-10-CM | POA: Diagnosis not present

## 2021-11-05 DIAGNOSIS — E669 Obesity, unspecified: Secondary | ICD-10-CM | POA: Diagnosis not present

## 2021-11-05 DIAGNOSIS — D509 Iron deficiency anemia, unspecified: Secondary | ICD-10-CM | POA: Diagnosis not present

## 2021-11-05 DIAGNOSIS — E7800 Pure hypercholesterolemia, unspecified: Secondary | ICD-10-CM | POA: Diagnosis not present

## 2021-11-05 DIAGNOSIS — I10 Essential (primary) hypertension: Secondary | ICD-10-CM | POA: Diagnosis not present

## 2021-12-09 DIAGNOSIS — H40062 Primary angle closure without glaucoma damage, left eye: Secondary | ICD-10-CM | POA: Diagnosis not present

## 2021-12-09 DIAGNOSIS — H402211 Chronic angle-closure glaucoma, right eye, mild stage: Secondary | ICD-10-CM | POA: Diagnosis not present

## 2021-12-25 DIAGNOSIS — H40062 Primary angle closure without glaucoma damage, left eye: Secondary | ICD-10-CM | POA: Diagnosis not present

## 2021-12-25 DIAGNOSIS — H402211 Chronic angle-closure glaucoma, right eye, mild stage: Secondary | ICD-10-CM | POA: Diagnosis not present

## 2022-02-09 DIAGNOSIS — H25811 Combined forms of age-related cataract, right eye: Secondary | ICD-10-CM | POA: Diagnosis not present

## 2022-02-09 DIAGNOSIS — H2511 Age-related nuclear cataract, right eye: Secondary | ICD-10-CM | POA: Diagnosis not present

## 2022-02-09 DIAGNOSIS — H269 Unspecified cataract: Secondary | ICD-10-CM | POA: Diagnosis not present

## 2022-02-11 ENCOUNTER — Ambulatory Visit: Payer: Medicare HMO | Admitting: Podiatry

## 2022-02-25 ENCOUNTER — Ambulatory Visit: Payer: Medicare HMO | Admitting: Podiatry

## 2022-02-25 ENCOUNTER — Encounter: Payer: Self-pay | Admitting: Podiatry

## 2022-02-25 DIAGNOSIS — M79674 Pain in right toe(s): Secondary | ICD-10-CM | POA: Diagnosis not present

## 2022-02-25 DIAGNOSIS — B351 Tinea unguium: Secondary | ICD-10-CM

## 2022-02-25 DIAGNOSIS — M79675 Pain in left toe(s): Secondary | ICD-10-CM | POA: Diagnosis not present

## 2022-02-25 NOTE — Progress Notes (Signed)
This patient returns to the office for evaluation and treatment of long thick painful nails .  This patient is unable to trim her own nails since the patient cannot reach her feet.  Patient says the nails are painful walking and wearing her shoes.  She returns for preventive foot care services.  General Appearance  Alert, conversant and in no acute stress.  Vascular  Dorsalis pedis and posterior tibial  pulses are palpable  bilaterally.  Capillary return is within normal limits  bilaterally. Temperature is within normal limits  bilaterally.  Neurologic  Senn-Weinstein monofilament wire test within normal limits  bilaterally. Muscle power within normal limits bilaterally.  Nails Thick disfigured discolored nails with subungual debris  from hallux to fifth toes bilaterally. No evidence of bacterial infection or drainage bilaterally.  Orthopedic  No limitations of motion  feet .  No crepitus or effusions noted.  No bony pathology or digital deformities noted.  Skin  normotropic skin with no porokeratosis noted bilaterally.  No signs of infections or ulcers noted.     Onychomycosis  Pain in toes right foot  Pain in toes left foot  Debridement  of nails  1-5  B/L with a nail nipper.  Nails were then filed using a dremel tool with no incidents.    RTC 4 months    Helane Gunther DPM

## 2022-03-23 DIAGNOSIS — H2512 Age-related nuclear cataract, left eye: Secondary | ICD-10-CM | POA: Diagnosis not present

## 2022-03-23 DIAGNOSIS — H269 Unspecified cataract: Secondary | ICD-10-CM | POA: Diagnosis not present

## 2022-05-18 DIAGNOSIS — I10 Essential (primary) hypertension: Secondary | ICD-10-CM | POA: Diagnosis not present

## 2022-05-18 DIAGNOSIS — Z0000 Encounter for general adult medical examination without abnormal findings: Secondary | ICD-10-CM | POA: Diagnosis not present

## 2022-05-18 DIAGNOSIS — E669 Obesity, unspecified: Secondary | ICD-10-CM | POA: Diagnosis not present

## 2022-05-18 DIAGNOSIS — E7800 Pure hypercholesterolemia, unspecified: Secondary | ICD-10-CM | POA: Diagnosis not present

## 2022-05-18 DIAGNOSIS — M8588 Other specified disorders of bone density and structure, other site: Secondary | ICD-10-CM | POA: Diagnosis not present

## 2022-05-18 DIAGNOSIS — H4089 Other specified glaucoma: Secondary | ICD-10-CM | POA: Diagnosis not present

## 2022-05-18 DIAGNOSIS — R7303 Prediabetes: Secondary | ICD-10-CM | POA: Diagnosis not present

## 2022-05-18 DIAGNOSIS — D509 Iron deficiency anemia, unspecified: Secondary | ICD-10-CM | POA: Diagnosis not present

## 2022-05-18 DIAGNOSIS — Z13820 Encounter for screening for osteoporosis: Secondary | ICD-10-CM | POA: Diagnosis not present

## 2022-05-19 ENCOUNTER — Other Ambulatory Visit: Payer: Self-pay | Admitting: Internal Medicine

## 2022-05-19 DIAGNOSIS — M8580 Other specified disorders of bone density and structure, unspecified site: Secondary | ICD-10-CM

## 2022-05-20 DIAGNOSIS — R7303 Prediabetes: Secondary | ICD-10-CM | POA: Diagnosis not present

## 2022-05-20 DIAGNOSIS — E7800 Pure hypercholesterolemia, unspecified: Secondary | ICD-10-CM | POA: Diagnosis not present

## 2022-05-20 DIAGNOSIS — D509 Iron deficiency anemia, unspecified: Secondary | ICD-10-CM | POA: Diagnosis not present

## 2022-05-20 DIAGNOSIS — I10 Essential (primary) hypertension: Secondary | ICD-10-CM | POA: Diagnosis not present

## 2022-06-03 ENCOUNTER — Ambulatory Visit: Payer: Medicare HMO | Admitting: Podiatry

## 2022-06-03 ENCOUNTER — Encounter: Payer: Self-pay | Admitting: Podiatry

## 2022-06-03 VITALS — BP 157/80 | HR 60

## 2022-06-03 DIAGNOSIS — M79675 Pain in left toe(s): Secondary | ICD-10-CM | POA: Diagnosis not present

## 2022-06-03 DIAGNOSIS — M79674 Pain in right toe(s): Secondary | ICD-10-CM

## 2022-06-03 DIAGNOSIS — B351 Tinea unguium: Secondary | ICD-10-CM | POA: Diagnosis not present

## 2022-06-03 NOTE — Progress Notes (Signed)
This patient returns to the office for evaluation and treatment of long thick painful nails .  This patient is unable to trim her own nails since the patient cannot reach her feet.  Patient says the nails are painful walking and wearing her shoes.  She returns for preventive foot care services.  General Appearance  Alert, conversant and in no acute stress.  Vascular  Dorsalis pedis and posterior tibial  pulses are palpable  bilaterally.  Capillary return is within normal limits  bilaterally. Temperature is within normal limits  bilaterally.  Neurologic  Senn-Weinstein monofilament wire test within normal limits  bilaterally. Muscle power within normal limits bilaterally.  Nails Thick disfigured discolored nails with subungual debris  from hallux to fifth toes bilaterally. No evidence of bacterial infection or drainage bilaterally.  Orthopedic  No limitations of motion  feet .  No crepitus or effusions noted.  No bony pathology or digital deformities noted.  Skin  normotropic skin with no porokeratosis noted bilaterally.  No signs of infections or ulcers noted.     Onychomycosis  Pain in toes right foot  Pain in toes left foot  Debridement  of nails  1-5  B/L with a nail nipper.  Nails were then filed using a dremel tool with no incidents.    RTC 3 months    Gardiner Barefoot DPM

## 2022-09-02 ENCOUNTER — Ambulatory Visit: Payer: Medicare HMO | Admitting: Podiatry

## 2022-09-02 ENCOUNTER — Encounter: Payer: Self-pay | Admitting: Podiatry

## 2022-09-02 DIAGNOSIS — M79674 Pain in right toe(s): Secondary | ICD-10-CM

## 2022-09-02 DIAGNOSIS — M79675 Pain in left toe(s): Secondary | ICD-10-CM | POA: Diagnosis not present

## 2022-09-02 DIAGNOSIS — B351 Tinea unguium: Secondary | ICD-10-CM | POA: Diagnosis not present

## 2022-09-02 NOTE — Progress Notes (Signed)

## 2022-11-17 DIAGNOSIS — R600 Localized edema: Secondary | ICD-10-CM | POA: Diagnosis not present

## 2022-11-17 DIAGNOSIS — I10 Essential (primary) hypertension: Secondary | ICD-10-CM | POA: Diagnosis not present

## 2022-11-17 DIAGNOSIS — B351 Tinea unguium: Secondary | ICD-10-CM | POA: Diagnosis not present

## 2022-11-17 DIAGNOSIS — E7800 Pure hypercholesterolemia, unspecified: Secondary | ICD-10-CM | POA: Diagnosis not present

## 2022-11-17 DIAGNOSIS — D509 Iron deficiency anemia, unspecified: Secondary | ICD-10-CM | POA: Diagnosis not present

## 2022-11-17 DIAGNOSIS — H4089 Other specified glaucoma: Secondary | ICD-10-CM | POA: Diagnosis not present

## 2022-11-18 ENCOUNTER — Other Ambulatory Visit: Payer: Self-pay | Admitting: Internal Medicine

## 2022-11-18 ENCOUNTER — Ambulatory Visit
Admission: RE | Admit: 2022-11-18 | Discharge: 2022-11-18 | Disposition: A | Discharge status: Home / Self Care | Payer: Medicare HMO | Source: Ambulatory Visit | Attending: Internal Medicine | Admitting: Internal Medicine

## 2022-11-18 DIAGNOSIS — E349 Endocrine disorder, unspecified: Secondary | ICD-10-CM | POA: Diagnosis not present

## 2022-11-18 DIAGNOSIS — N958 Other specified menopausal and perimenopausal disorders: Secondary | ICD-10-CM | POA: Diagnosis not present

## 2022-11-18 DIAGNOSIS — M8588 Other specified disorders of bone density and structure, other site: Secondary | ICD-10-CM | POA: Diagnosis not present

## 2022-11-18 DIAGNOSIS — M8580 Other specified disorders of bone density and structure, unspecified site: Secondary | ICD-10-CM

## 2022-11-18 DIAGNOSIS — Z0000 Encounter for general adult medical examination without abnormal findings: Secondary | ICD-10-CM

## 2022-12-02 ENCOUNTER — Encounter: Payer: Self-pay | Admitting: Podiatry

## 2022-12-02 ENCOUNTER — Ambulatory Visit: Admission: RE | Admit: 2022-12-02 | Payer: Medicare HMO | Source: Ambulatory Visit

## 2022-12-02 ENCOUNTER — Ambulatory Visit: Payer: Medicare HMO | Admitting: Podiatry

## 2022-12-02 DIAGNOSIS — Z0000 Encounter for general adult medical examination without abnormal findings: Secondary | ICD-10-CM

## 2022-12-02 DIAGNOSIS — B351 Tinea unguium: Secondary | ICD-10-CM

## 2022-12-02 DIAGNOSIS — M79674 Pain in right toe(s): Secondary | ICD-10-CM

## 2022-12-02 DIAGNOSIS — M79675 Pain in left toe(s): Secondary | ICD-10-CM | POA: Diagnosis not present

## 2022-12-02 DIAGNOSIS — Z1231 Encounter for screening mammogram for malignant neoplasm of breast: Secondary | ICD-10-CM | POA: Diagnosis not present

## 2022-12-02 NOTE — Progress Notes (Signed)
This patient returns to the office for evaluation and treatment of long thick painful nails .  This patient is unable to trim her own nails since the patient cannot reach her feet.  Patient says the nails are painful walking and wearing her shoes.  She returns for preventive foot care services.  General Appearance  Alert, conversant and in no acute stress.  Vascular  Dorsalis pedis and posterior tibial  pulses are palpable  bilaterally.  Capillary return is within normal limits  bilaterally. Temperature is within normal limits  bilaterally.  Neurologic  Senn-Weinstein monofilament wire test within normal limits  bilaterally. Muscle power within normal limits bilaterally.  Nails Thick disfigured discolored nails with subungual debris  from hallux to fifth toes bilaterally. No evidence of bacterial infection or drainage bilaterally.  Orthopedic  No limitations of motion  feet .  No crepitus or effusions noted.  No bony pathology or digital deformities noted.  Skin  normotropic skin with no porokeratosis noted bilaterally.  No signs of infections or ulcers noted.     Onychomycosis  Pain in toes right foot  Pain in toes left foot  Debridement  of nails  1-5  B/L with a nail nipper.  Nails were then filed using a dremel tool with no incidents.    RTC  3 months   Gregory Mayer DPM   

## 2022-12-03 ENCOUNTER — Ambulatory Visit: Payer: Medicare HMO | Admitting: Podiatry

## 2023-02-25 ENCOUNTER — Encounter: Payer: Self-pay | Admitting: Podiatry

## 2023-03-04 ENCOUNTER — Ambulatory Visit: Payer: Medicare HMO | Admitting: Podiatry

## 2023-03-24 ENCOUNTER — Ambulatory Visit: Payer: Medicare HMO | Admitting: Podiatry

## 2023-03-25 ENCOUNTER — Encounter: Payer: Self-pay | Admitting: Podiatry

## 2023-03-25 ENCOUNTER — Ambulatory Visit: Payer: Medicare HMO | Admitting: Podiatry

## 2023-03-25 DIAGNOSIS — B351 Tinea unguium: Secondary | ICD-10-CM | POA: Diagnosis not present

## 2023-03-25 DIAGNOSIS — M79675 Pain in left toe(s): Secondary | ICD-10-CM

## 2023-03-25 DIAGNOSIS — M79674 Pain in right toe(s): Secondary | ICD-10-CM

## 2023-03-25 NOTE — Progress Notes (Signed)
This patient returns to the office for evaluation and treatment of long thick painful nails .  This patient is unable to trim her own nails since the patient cannot reach her feet.  Patient says the nails are painful walking and wearing her shoes.  She returns for preventive foot care services.  General Appearance  Alert, conversant and in no acute stress.  Vascular  Dorsalis pedis and posterior tibial  pulses are palpable  bilaterally.  Capillary return is within normal limits  bilaterally. Temperature is within normal limits  bilaterally.  Neurologic  Senn-Weinstein monofilament wire test within normal limits  bilaterally. Muscle power within normal limits bilaterally.  Nails Thick disfigured discolored nails with subungual debris  from hallux to fifth toes bilaterally. No evidence of bacterial infection or drainage bilaterally.  Orthopedic  No limitations of motion  feet .  No crepitus or effusions noted.  No bony pathology or digital deformities noted.  Skin  normotropic skin with no porokeratosis noted bilaterally.  No signs of infections or ulcers noted.     Onychomycosis  Pain in toes right foot  Pain in toes left foot  Debridement  of nails  1-5  B/L with a nail nipper.  Nails were then filed using a dremel tool with no incidents.    RTC  3 months   Nitza Schmid DPM   

## 2023-05-20 DIAGNOSIS — E7800 Pure hypercholesterolemia, unspecified: Secondary | ICD-10-CM | POA: Diagnosis not present

## 2023-05-20 DIAGNOSIS — E669 Obesity, unspecified: Secondary | ICD-10-CM | POA: Diagnosis not present

## 2023-05-20 DIAGNOSIS — I10 Essential (primary) hypertension: Secondary | ICD-10-CM | POA: Diagnosis not present

## 2023-05-20 DIAGNOSIS — Z0000 Encounter for general adult medical examination without abnormal findings: Secondary | ICD-10-CM | POA: Diagnosis not present

## 2023-05-20 DIAGNOSIS — R600 Localized edema: Secondary | ICD-10-CM | POA: Diagnosis not present

## 2023-05-20 DIAGNOSIS — H4089 Other specified glaucoma: Secondary | ICD-10-CM | POA: Diagnosis not present

## 2023-05-20 DIAGNOSIS — R7303 Prediabetes: Secondary | ICD-10-CM | POA: Diagnosis not present

## 2023-05-20 DIAGNOSIS — D509 Iron deficiency anemia, unspecified: Secondary | ICD-10-CM | POA: Diagnosis not present

## 2023-05-20 DIAGNOSIS — B351 Tinea unguium: Secondary | ICD-10-CM | POA: Diagnosis not present

## 2023-06-23 ENCOUNTER — Encounter: Payer: Self-pay | Admitting: Podiatry

## 2023-06-23 ENCOUNTER — Ambulatory Visit: Payer: Medicare HMO | Admitting: Podiatry

## 2023-06-23 DIAGNOSIS — B351 Tinea unguium: Secondary | ICD-10-CM | POA: Diagnosis not present

## 2023-06-23 DIAGNOSIS — M79675 Pain in left toe(s): Secondary | ICD-10-CM

## 2023-06-23 DIAGNOSIS — M79674 Pain in right toe(s): Secondary | ICD-10-CM

## 2023-06-23 NOTE — Progress Notes (Signed)
This patient returns to the office for evaluation and treatment of long thick painful nails .  This patient is unable to trim her own nails since the patient cannot reach her feet.  Patient says the nails are painful walking and wearing her shoes.  She returns for preventive foot care services.  General Appearance  Alert, conversant and in no acute stress.  Vascular  Dorsalis pedis and posterior tibial  pulses are palpable  bilaterally.  Capillary return is within normal limits  bilaterally. Temperature is within normal limits  bilaterally.  Neurologic  Senn-Weinstein monofilament wire test within normal limits  bilaterally. Muscle power within normal limits bilaterally.  Nails Thick disfigured discolored nails with subungual debris  from hallux to fifth toes bilaterally. No evidence of bacterial infection or drainage bilaterally.  Orthopedic  No limitations of motion  feet .  No crepitus or effusions noted.  No bony pathology or digital deformities noted.  Skin  normotropic skin with no porokeratosis noted bilaterally.  No signs of infections or ulcers noted.     Onychomycosis  Pain in toes right foot  Pain in toes left foot  Debridement  of nails  1-5  B/L with a nail nipper.  Nails were then filed using a dremel tool with no incidents.    RTC  3 months   Nitza Schmid DPM   

## 2023-09-22 ENCOUNTER — Ambulatory Visit: Admitting: Podiatry

## 2023-09-22 ENCOUNTER — Encounter: Payer: Self-pay | Admitting: Podiatry

## 2023-09-22 DIAGNOSIS — B351 Tinea unguium: Secondary | ICD-10-CM | POA: Diagnosis not present

## 2023-09-22 DIAGNOSIS — M79675 Pain in left toe(s): Secondary | ICD-10-CM | POA: Diagnosis not present

## 2023-09-22 DIAGNOSIS — M79674 Pain in right toe(s): Secondary | ICD-10-CM

## 2023-09-22 NOTE — Progress Notes (Signed)
This patient returns to the office for evaluation and treatment of long thick painful nails .  This patient is unable to trim her own nails since the patient cannot reach her feet.  Patient says the nails are painful walking and wearing her shoes.  She returns for preventive foot care services.  General Appearance  Alert, conversant and in no acute stress.  Vascular  Dorsalis pedis and posterior tibial  pulses are palpable  bilaterally.  Capillary return is within normal limits  bilaterally. Temperature is within normal limits  bilaterally.  Neurologic  Senn-Weinstein monofilament wire test within normal limits  bilaterally. Muscle power within normal limits bilaterally.  Nails Thick disfigured discolored nails with subungual debris  from hallux to fifth toes bilaterally. No evidence of bacterial infection or drainage bilaterally.  Orthopedic  No limitations of motion  feet .  No crepitus or effusions noted.  No bony pathology or digital deformities noted.  Skin  normotropic skin with no porokeratosis noted bilaterally.  No signs of infections or ulcers noted.     Onychomycosis  Pain in toes right foot  Pain in toes left foot  Debridement  of nails  1-5  B/L with a nail nipper.  Nails were then filed using a dremel tool with no incidents.    RTC  3 months   Nitza Schmid DPM   

## 2023-10-01 DIAGNOSIS — J988 Other specified respiratory disorders: Secondary | ICD-10-CM | POA: Diagnosis not present

## 2023-10-28 ENCOUNTER — Other Ambulatory Visit: Payer: Self-pay | Admitting: Internal Medicine

## 2023-10-28 DIAGNOSIS — Z1231 Encounter for screening mammogram for malignant neoplasm of breast: Secondary | ICD-10-CM

## 2023-11-17 DIAGNOSIS — I10 Essential (primary) hypertension: Secondary | ICD-10-CM | POA: Diagnosis not present

## 2023-11-17 DIAGNOSIS — E669 Obesity, unspecified: Secondary | ICD-10-CM | POA: Diagnosis not present

## 2023-11-17 DIAGNOSIS — R195 Other fecal abnormalities: Secondary | ICD-10-CM | POA: Diagnosis not present

## 2023-11-17 DIAGNOSIS — D509 Iron deficiency anemia, unspecified: Secondary | ICD-10-CM | POA: Diagnosis not present

## 2023-11-17 DIAGNOSIS — R7303 Prediabetes: Secondary | ICD-10-CM | POA: Diagnosis not present

## 2023-11-17 DIAGNOSIS — E7800 Pure hypercholesterolemia, unspecified: Secondary | ICD-10-CM | POA: Diagnosis not present

## 2023-11-17 DIAGNOSIS — Z6832 Body mass index (BMI) 32.0-32.9, adult: Secondary | ICD-10-CM | POA: Diagnosis not present

## 2023-11-18 DIAGNOSIS — H4089 Other specified glaucoma: Secondary | ICD-10-CM | POA: Diagnosis not present

## 2023-11-18 DIAGNOSIS — E669 Obesity, unspecified: Secondary | ICD-10-CM | POA: Diagnosis not present

## 2023-11-18 DIAGNOSIS — E7800 Pure hypercholesterolemia, unspecified: Secondary | ICD-10-CM | POA: Diagnosis not present

## 2023-11-18 DIAGNOSIS — I10 Essential (primary) hypertension: Secondary | ICD-10-CM | POA: Diagnosis not present

## 2023-12-08 ENCOUNTER — Ambulatory Visit

## 2023-12-19 DIAGNOSIS — E7800 Pure hypercholesterolemia, unspecified: Secondary | ICD-10-CM | POA: Diagnosis not present

## 2023-12-19 DIAGNOSIS — E669 Obesity, unspecified: Secondary | ICD-10-CM | POA: Diagnosis not present

## 2023-12-19 DIAGNOSIS — I10 Essential (primary) hypertension: Secondary | ICD-10-CM | POA: Diagnosis not present

## 2023-12-19 DIAGNOSIS — H4089 Other specified glaucoma: Secondary | ICD-10-CM | POA: Diagnosis not present

## 2023-12-22 ENCOUNTER — Ambulatory Visit
Admission: RE | Admit: 2023-12-22 | Discharge: 2023-12-22 | Disposition: A | Discharge status: Home / Self Care | Source: Ambulatory Visit | Attending: Internal Medicine | Admitting: Internal Medicine

## 2023-12-22 DIAGNOSIS — Z1231 Encounter for screening mammogram for malignant neoplasm of breast: Secondary | ICD-10-CM

## 2023-12-29 ENCOUNTER — Ambulatory Visit: Admitting: Podiatry

## 2024-01-10 ENCOUNTER — Ambulatory Visit: Admitting: Podiatry

## 2024-01-18 DIAGNOSIS — H4089 Other specified glaucoma: Secondary | ICD-10-CM | POA: Diagnosis not present

## 2024-01-18 DIAGNOSIS — I10 Essential (primary) hypertension: Secondary | ICD-10-CM | POA: Diagnosis not present

## 2024-01-18 DIAGNOSIS — E7800 Pure hypercholesterolemia, unspecified: Secondary | ICD-10-CM | POA: Diagnosis not present

## 2024-01-18 DIAGNOSIS — E669 Obesity, unspecified: Secondary | ICD-10-CM | POA: Diagnosis not present

## 2024-01-26 ENCOUNTER — Ambulatory Visit: Admitting: Podiatry

## 2024-01-26 ENCOUNTER — Encounter: Payer: Self-pay | Admitting: Podiatry

## 2024-01-26 DIAGNOSIS — M79674 Pain in right toe(s): Secondary | ICD-10-CM | POA: Diagnosis not present

## 2024-01-26 DIAGNOSIS — B351 Tinea unguium: Secondary | ICD-10-CM | POA: Diagnosis not present

## 2024-01-26 DIAGNOSIS — M79675 Pain in left toe(s): Secondary | ICD-10-CM

## 2024-01-26 NOTE — Progress Notes (Signed)
This patient returns to the office for evaluation and treatment of long thick painful nails .  This patient is unable to trim her own nails since the patient cannot reach her feet.  Patient says the nails are painful walking and wearing her shoes.  She returns for preventive foot care services.  General Appearance  Alert, conversant and in no acute stress.  Vascular  Dorsalis pedis and posterior tibial  pulses are palpable  bilaterally.  Capillary return is within normal limits  bilaterally. Temperature is within normal limits  bilaterally.  Neurologic  Senn-Weinstein monofilament wire test within normal limits  bilaterally. Muscle power within normal limits bilaterally.  Nails Thick disfigured discolored nails with subungual debris  from hallux to fifth toes bilaterally. No evidence of bacterial infection or drainage bilaterally.  Orthopedic  No limitations of motion  feet .  No crepitus or effusions noted.  No bony pathology or digital deformities noted.  Skin  normotropic skin with no porokeratosis noted bilaterally.  No signs of infections or ulcers noted.     Onychomycosis  Pain in toes right foot  Pain in toes left foot  Debridement  of nails  1-5  B/L with a nail nipper.  Nails were then filed using a dremel tool with no incidents.    RTC  3 months   Nitza Schmid DPM   

## 2024-02-18 DIAGNOSIS — H4089 Other specified glaucoma: Secondary | ICD-10-CM | POA: Diagnosis not present

## 2024-02-18 DIAGNOSIS — I10 Essential (primary) hypertension: Secondary | ICD-10-CM | POA: Diagnosis not present

## 2024-02-18 DIAGNOSIS — E669 Obesity, unspecified: Secondary | ICD-10-CM | POA: Diagnosis not present

## 2024-02-18 DIAGNOSIS — E7800 Pure hypercholesterolemia, unspecified: Secondary | ICD-10-CM | POA: Diagnosis not present

## 2024-04-19 ENCOUNTER — Ambulatory Visit (HOSPITAL_COMMUNITY): Admission: EM | Admit: 2024-04-19 | Discharge: 2024-04-19 | Disposition: A | Discharge status: Home / Self Care

## 2024-04-19 ENCOUNTER — Emergency Department (HOSPITAL_BASED_OUTPATIENT_CLINIC_OR_DEPARTMENT_OTHER)

## 2024-04-19 ENCOUNTER — Emergency Department (HOSPITAL_BASED_OUTPATIENT_CLINIC_OR_DEPARTMENT_OTHER)
Admission: EM | Admit: 2024-04-19 | Discharge: 2024-04-20 | Disposition: A | Discharge status: Home / Self Care | Source: Ambulatory Visit | Attending: Emergency Medicine | Admitting: Emergency Medicine

## 2024-04-19 ENCOUNTER — Encounter (HOSPITAL_COMMUNITY): Payer: Self-pay | Admitting: *Deleted

## 2024-04-19 ENCOUNTER — Encounter (HOSPITAL_BASED_OUTPATIENT_CLINIC_OR_DEPARTMENT_OTHER): Payer: Self-pay

## 2024-04-19 ENCOUNTER — Other Ambulatory Visit: Payer: Self-pay

## 2024-04-19 DIAGNOSIS — W108XXA Fall (on) (from) other stairs and steps, initial encounter: Secondary | ICD-10-CM | POA: Diagnosis not present

## 2024-04-19 DIAGNOSIS — S0083XA Contusion of other part of head, initial encounter: Secondary | ICD-10-CM | POA: Insufficient documentation

## 2024-04-19 DIAGNOSIS — S022XXA Fracture of nasal bones, initial encounter for closed fracture: Secondary | ICD-10-CM | POA: Diagnosis not present

## 2024-04-19 DIAGNOSIS — R040 Epistaxis: Secondary | ICD-10-CM | POA: Diagnosis present

## 2024-04-19 DIAGNOSIS — W19XXXA Unspecified fall, initial encounter: Secondary | ICD-10-CM

## 2024-04-19 MED ORDER — TRAMADOL HCL 50 MG PO TABS: 50.0000 mg | ORAL_TABLET | Freq: Four times a day (QID) | ORAL | 0 refills | Status: AC | PRN

## 2024-04-19 MED ORDER — OXYMETAZOLINE HCL 0.05 % NA SOLN
1.0000 | Freq: Once | NASAL | Status: AC
  Administered 2024-04-19: 1 via NASAL
  Filled 2024-04-19: qty 30

## 2024-04-19 NOTE — ED Provider Notes (Signed)
 " [place] EMERGENC[facility] DEPARTMENT AT [facility] Provider Note   CSN: [id number] Arrival date & time: 04/19/24  2036     Patient presents with: Pamela Conner Pamela Conner is a 73 y.o. female.   Patient to ED after mechanical fall earlier this evening after she missed a step. She fell forward, hitting her left face on the concrete. No LOC, no nausea/vomiting. She reports epistaxis at the time of fall. Not anticoagulated. No visual change. She has been ambulatory since the fall.   The history is provided by the patient. No language interpreter was used.  Fall       Prior to Admission medications  Medication Sig Start Date End Date Taking? Authorizing Provider  traMADol (ULTRAM) 50 MG tablet Take 1 tablet (50 mg total) by mouth every 6 (six) hours as needed. 04/19/24  Yes Lourine Alberico, Margit, PA-C  amLODipine (NORVASC) 10 MG tablet Take 10 mg by mouth daily.    [provider]  aspirin 81 MG tablet Take 81 mg by mouth daily.    [provider]  calcium carbonate (SUPER CALCIUM) 1500 (600 Ca) MG TABS tablet Take 600 mg of elemental calcium by mouth daily with breakfast.    [provider]  Cholecalciferol 50 MCG (2000 UT) CAPS Take 1 capsule by mouth daily at 2 am.    [provider]  hydrochlorothiazide (HYDRODIURIL) 25 MG tablet Take 25 mg by mouth daily.    [provider]  IRON CR PO Take by mouth.    [provider]  metoprolol succinate (TOPROL-XL) 100 MG 24 hr tablet Take 100 mg by mouth daily. Take with or immediately following a meal.    [provider]  metoprolol tartrate (LOPRESSOR) 100 MG tablet Take 100 mg by mouth 2 (two) times daily.    [provider]  simvastatin (ZOCOR) 20 MG tablet Take 20 mg by mouth daily at 2 am.    [provider]  valsartan (DIOVAN) 320 MG tablet Take 320 mg by mouth daily.    [provider]    Allergies: Ace inhibitors    Review of Systems  Updated  Vital Signs BP (!) 163/88 (BP Location: Right Arm)   Pulse 70   Temp 97.7 F (36.5 C) (Oral)   Resp 20   Ht 5' 3 (1.6 m)   Wt 81.6 kg   SpO2 100%   BMI 31.89 kg/m   Physical Exam Vitals and nursing note reviewed.  Constitutional:      General: She is not in acute distress.    Appearance: Normal appearance.  HENT:     Head:     Comments: Swelling over left side of nose. No active bleeding from nose. No facial bony tenderness or deformity.     Nose:     Comments: No active bleeding. No visualized septal hematoma.  Eyes:     Pupils: Pupils are equal, round, and reactive to light.     Comments: Small subconjunctival hemorrhage to lateral left sclera. No hyphema. FROM. No pain with eye movement.   Neck:     Comments: No midline tenderness.  Cardiovascular:     Rate and Rhythm: Normal rate.  Pulmonary:     Effort: Pulmonary effort is normal.  Chest:     Chest wall: No tenderness.  Abdominal:     Tenderness: There is no abdominal tenderness.  Musculoskeletal:        General: Normal range of motion.  Cervical back: Normal range of motion and neck supple.  Skin:    General: Skin is warm and dry.  Neurological:     Mental Status: She is alert and oriented to person, place, and time.     (all labs ordered are listed, but only abnormal results are displayed) Labs Reviewed - No data to display  EKG: None  Radiology: CT Maxillofacial Wo Contrast Result Date: 04/19/2024 EXAM: CT OF THE FACE WITHOUT CONTRAST 04/19/2024 09:05:26 PM TECHNIQUE: CT of the face was performed without the administration of intravenous contrast. Multiplanar reformatted images are provided for review. Automated exposure control, iterative reconstruction, and/or weight based adjustment of the mA/kV was utilized to reduce the radiation dose to as low as reasonably achievable. COMPARISON: None available. CLINICAL HISTORY: Facial trauma, blunt. FINDINGS: FACIAL BONES: Mildly displaced fractures of the  nasal bones with associated soft tissue swelling. No mandibular dislocation. No suspicious bone lesion. ORBITS: Globes are intact. No acute traumatic injury. No inflammatory change. SINUSES AND MASTOIDS: No acute abnormality. SOFT TISSUES: Associated soft tissue swelling with the nasal bone fractures. No other acute soft tissue abnormality. IMPRESSION: 1. Mildly displaced fractures of the nasal bones with associated soft tissue swelling. Electronically signed by: Franky Stanford MD 04/19/2024 09:15 PM EST RP Workstation: [id number]   CT Cervical Spine Wo Contrast Result Date: 04/19/2024 EXAM: CT CERVICAL SPINE WITHOUT CONTRAST 04/19/2024 09:05:26 PM TECHNIQUE: CT of the cervical spine was performed without the administration of intravenous contrast. Multiplanar reformatted images are provided for review. Automated exposure control, iterative reconstruction, and/or weight based adjustment of the mA/kV was utilized to reduce the radiation dose to as low as reasonably achievable. COMPARISON: None available. CLINICAL HISTORY: Facial trauma, blunt Facial trauma, blunt FINDINGS: BONES AND ALIGNMENT: No acute fracture or traumatic malalignment. DEGENERATIVE CHANGES: No significant degenerative changes. SOFT TISSUES: No prevertebral soft tissue swelling. IMPRESSION: 1. No significant abnormality Electronically signed by: Franky Stanford MD 04/19/2024 09:13 PM EST RP Workstation: [id number]   CT Head Wo Contrast Result Date: 04/19/2024 EXAM: CT HEAD WITHOUT CONTRAST 04/19/2024 09:05:26 PM TECHNIQUE: CT of the head was performed without the administration of intravenous contrast. Automated exposure control, iterative reconstruction, and/or weight based adjustment of the mA/kV was utilized to reduce the radiation dose to as low as reasonably achievable. COMPARISON: None available. CLINICAL HISTORY: Facial trauma, blunt. FINDINGS: BRAIN AND VENTRICLES: No acute hemorrhage. No evidence of acute infarct. No hydrocephalus. No  extra-axial collection. No mass effect or midline shift. Atrophy and chronic small vessel disease changes. Mildly enlarged appearance of the pituitary gland for age. Consider nonemergent outpatient pituitary MRI. ORBITS: No acute abnormality. SINUSES: No acute abnormality. SOFT TISSUES AND SKULL: Soft tissue swelling. Mildly displaced nasal bone fractures. No skull fracture. IMPRESSION: 1. No acute intracranial abnormality. 2. Mildly displaced nasal bone fractures and soft tissue swelling, consistent with facial trauma. 3. Mildly enlarged appearance of the pituitary gland for age, and nonemergent outpatient pituitary MRI is recommended. Electronically signed by: Franky Stanford MD 04/19/2024 09:11 PM EST RP Workstation: [id number]     Procedures   Medications Ordered in the ED  oxymetazoline (AFRIN) 0.05 % nasal spray 1 spray (1 spray Each Nare Given 04/19/24 2227)    Clinical Course as of 04/19/24 2322  Wed Apr 19, 2024  2122 Patient with facial swelling after mechanical fall earlier this evening. Not anticoagulated. CT scans of face, c-spine and head, per radiology, interpreted as: HEAD: IMPRESSION: 1. No acute intracranial abnormality. 2. Mildly displaced nasal bone fractures and soft tissue  swelling, consistent with facial trauma. 3. Mildly enlarged appearance of the pituitary gland for age, and nonemergent outpatient pituitary MRI is recommended. C-SPINE:  IMPRESSION: 1. No significant abnormality   FACE: IMPRESSION: 1. Mildly displaced fractures of the nasal bones with associated soft tissue swelling.   [SU]  2125 Patient is awake, alert. No active epistaxis. Discussed care if bleeding should recur. Tylenol for discomfort.  [SU]    Clinical Course User Index [SU] Odell Balls, PA-C                                 Medical Decision Making Amount and/or Complexity of Data Reviewed Radiology: ordered.  Risk OTC drugs.        Final diagnoses:  Fall, initial  encounter  Contusion of face, initial encounter  Closed fracture of nasal bone, initial encounter    ED Discharge Orders          Ordered    traMADol (ULTRAM) 50 MG tablet  Every 6 hours PRN        04/19/24 2318               Odell Balls, PA-C 04/19/24 2322    Lenor Hollering, MD 04/19/24 2326  "

## 2024-04-19 NOTE — ED Notes (Signed)
 Patient is being discharged from the Urgent Care and sent to the Emergency Department via pov . Per FABIENE Eland, FNP, patient is in need of higher level of care due to Fall/head trauma. Patient is aware and verbalizes understanding of plan of care.  Vitals:   04/19/24 1941  BP: (!) 158/68  Pulse: 63  Resp: 18  Temp: (!) 97.5 F (36.4 C)  SpO2: 97%

## 2024-04-19 NOTE — ED Triage Notes (Signed)
 Pt reports she was waling down steps while talking and fell. I hit the cement real hard Reports bleeding from nose. Teeth intact. Denies LOC

## 2024-04-19 NOTE — ED Provider Notes (Signed)
 " [facility]    CSN: [id number] Arrival date & time: 04/19/24  1931      History   Chief Complaint Chief Complaint  Patient presents with   Fall    HPI Pamela Conner is a 73 y.o. female.   Pamela Conner presents today after having a fall tonight.  She reports that she was distracted while walking and missed a step and fell on her face.  She did not lose consciousness and has no amnesia from the event.  She reports that after the fall her nose began bleeding.  She reports nasal tenderness and swelling.  Daughter reports a small subconjunctival hemorrhage to the left eye.  Pamela Conner denies any visual disturbances, blurred vision, diplopia, pain with extraocular movements, or facial pain aside from the nose.  She takes a baby aspirin but is not on any other anticoagulation.  Daughter is concerned for nasal fracture  The history is provided by the patient.  Fall    Past Medical History:  Diagnosis Date   Hypertension     There are no active problems to display for this patient.   History reviewed. No pertinent surgical history.  OB History   No obstetric history on file.      Home Medications    Prior to Admission medications  Medication Sig Start Date End Date Taking? Authorizing Provider  amLODipine (NORVASC) 10 MG tablet Take 10 mg by mouth daily.   Yes [provider]  aspirin 81 MG tablet Take 81 mg by mouth daily.   Yes [provider]  metoprolol tartrate (LOPRESSOR) 100 MG tablet Take 100 mg by mouth 2 (two) times daily.   Yes [provider]  simvastatin (ZOCOR) 20 MG tablet Take 20 mg by mouth daily at 2 am.   Yes [provider]  calcium carbonate (SUPER CALCIUM) 1500 (600 Ca) MG TABS tablet Take 600 mg of elemental calcium by mouth daily with breakfast.    [provider]  Cholecalciferol 50 MCG (2000 UT) CAPS Take 1 capsule by mouth daily at 2 am.    [provider]  hydrochlorothiazide  (HYDRODIURIL) 25 MG tablet Take 25 mg by mouth daily.    [provider]  IRON CR PO Take by mouth.    [provider]  metoprolol succinate (TOPROL-XL) 100 MG 24 hr tablet Take 100 mg by mouth daily. Take with or immediately following a meal.    [provider]  valsartan (DIOVAN) 320 MG tablet Take 320 mg by mouth daily.    [provider]    Family History History reviewed. No pertinent family history.  Social History Social History[1]   Allergies   Ace inhibitors   Review of Systems Review of Systems  Constitutional: Negative.   HENT:  Positive for facial swelling and nosebleeds.   Neurological: Negative.      Physical Exam Triage Vital Signs ED Triage Vitals  Encounter Vitals Group     BP 04/19/24 1941 (!) 158/68     Girls Systolic BP Percentile --      Girls Diastolic BP Percentile --      Boys Systolic BP Percentile --      Boys Diastolic BP Percentile --      Pulse Rate 04/19/24 1941 63     Resp 04/19/24 1941 18     Temp 04/19/24 1941 (!) 97.5 F (36.4 C)     Temp Source 04/19/24 1941 Oral     SpO2  04/19/24 1941 97 %     Weight --      Height --      Head Circumference --      Peak Flow --      Pain Score 04/19/24 1936 2     Pain Loc --      Pain Education --      Exclude from Growth Chart --    No data found.  Updated Vital Signs BP (!) 158/68 (BP Location: Left Arm)   Pulse 63   Temp (!) 97.5 F (36.4 C) (Oral)   Resp 18   SpO2 97%   Visual Acuity Right Eye Distance:   Left Eye Distance:   Bilateral Distance:    Right Eye Near:   Left Eye Near:    Bilateral Near:     Physical Exam Vitals and nursing note reviewed. Exam conducted with a chaperone present.  Constitutional:      General: She is not in acute distress.    Appearance: Normal appearance. She is normal weight. She is not toxic-appearing.  HENT:     Head: Abrasion (Abrasion to left nose/cheek junction, superficial, not bleeding) present. No  raccoon eyes or Battle's sign.     Comments: Moderate erythema and swelling noted to the left side of the nose and cheek area.  Not tender to palpation    Nose: Nasal deformity, septal deviation, signs of injury and nasal tenderness present.     Right Nostril: Epistaxis present. No septal hematoma or occlusion.     Left Nostril: Epistaxis present. No septal hematoma or occlusion.     Right Turbinates: Swollen.     Left Turbinates: Swollen.     Comments: Left-sided epistaxis appears to be coming from anterior nasal cartilage.  Unable to visualize origin of right sided epistaxis.  Small amount of blood noted in bilateral nares.  No active bleeding visualized    Mouth/Throat:     Tonsils: 1+ on the right. 1+ on the left.  Eyes:     General: Lids are normal.     Extraocular Movements: Extraocular movements intact.     Right eye: Normal extraocular motion and no nystagmus.     Left eye: Normal extraocular motion and no nystagmus.     Conjunctiva/sclera:     Right eye: Right conjunctiva is not injected. No hemorrhage.    Left eye: Left conjunctiva is not injected. Hemorrhage (Small pinpoint subconjunctival hemorrhage to the lateral aspect of the left bulbar conjunctive) present.  Cardiovascular:     Rate and Rhythm: Normal rate and regular rhythm.     Pulses:          Radial pulses are 2+ on the right side and 2+ on the left side.       Dorsalis pedis pulses are 2+ on the right side and 2+ on the left side.       Posterior tibial pulses are 2+ on the right side and 2+ on the left side.     Heart sounds: Normal heart sounds.  Pulmonary:     Effort: Pulmonary effort is normal.     Breath sounds: Normal breath sounds.  Musculoskeletal:     Lumbar back: Decreased range of motion: Guarded due to pain. Negative right straight leg raise test and negative left straight leg raise test.  Lymphadenopathy:     Cervical:     Right cervical: No posterior cervical adenopathy.    Left cervical: No  posterior cervical adenopathy.  Skin:  General: Skin is warm and dry.     Findings: No rash.  Neurological:     Mental Status: She is alert and oriented to person, place, and time.     GCS: GCS eye subscore is 4. GCS verbal subscore is 5. GCS motor subscore is 6.     Cranial Nerves: Cranial nerves 2-12 are intact.     Sensory: Sensation is intact.     Motor: Motor function is intact.     Coordination: Coordination is intact.     Gait: Gait is intact.  Psychiatric:        Mood and Affect: Mood normal.        Behavior: Behavior normal.      UC Treatments / Results  Labs (all labs ordered are listed, but only abnormal results are displayed) Labs Reviewed - No data to display  EKG   Radiology No results found.  Procedures Procedures (including critical care time)  Medications Ordered in UC Medications - No data to display  Initial Impression / Assessment and Plan / UC Course  I have reviewed the triage vital signs and the nursing notes.  Pertinent labs & imaging results that were available during my care of the patient were reviewed by me and considered in my medical decision making (see chart for details).     Swelling of nose Unable to rule out nasal bone fracture in urgent care setting as CT is gold standard for diagnosis.  Epistaxis has been controlled by time and pressure, and patient is not actively bleeding at this time.  She does have some tenderness to the bone/cartilage junction and moderate swelling of the nose. 2.  Facial trauma She does have some redness and swelling to the right cheek.  She did not report any tenderness to palpation of the periorbital area.  No step-off noted.  Extraocular movements intact without pain.  She denies any visual deficit.  Although periorbital fracture cannot be definitively ruled out without CT, I have low suspicion for it at this time  Discussion with patient and daughter at time of visit.  I advised that nasal bone fracture  is best diagnosed via CT which is unavailable at this clinic.  I advised to the daughter that options for diagnosis would be outpatient ENT follow-up or ED visit.  Daughter is opting to take the patient to the emergency room tonight for follow-up for evaluation of possible nasal bone fracture Final Clinical Impressions(s) / UC Diagnoses   Final diagnoses:  Epistaxis  Fall from standing, initial encounter     Discharge Instructions       Reason for visit You were evaluated after a fall onto your face with a nosebleed (epistaxis). Based on your symptoms and exam, there is concern for a possible nasal fracture. Plan You have chosen to go to the Emergency Department (ED) for further evaluation and imaging (CT scan) to assess for a broken nose and any associated facial injury. What to do now Go directly to the ED for evaluation and imaging. Avoid blowing your nose. If bleeding recurs, apply firm pressure by pinching the soft part of the nose for 10-15 minutes while leaning slightly forward. Apply ice to the nose/face (wrapped in cloth) for 15-20 minutes at a time to reduce swelling. Avoid NSAIDs (ibuprofen, naproxen) until cleared, as they may worsen bleeding unless otherwise directed. Go to the ED immediately or tell staff if you have Persistent or heavy nosebleed Worsening pain or swelling Difficulty breathing through the nose  Clear fluid draining from the nose Vision changes, severe headache, vomiting, dizziness, or confusion Follow-up Follow ED recommendations after imaging. You may need ENT follow-up depending on results. If you have questions or symptoms worsen before arrival, seek emergency care immediately.     ED Prescriptions   None    PDMP not reviewed this encounter.     [1]  Social History Tobacco Use   Smoking status: Never   Smokeless tobacco: Never  Substance Use Topics   Alcohol use: No   Drug use: No     Leatrice Vernell HERO, NP 04/19/24 2051  "

## 2024-04-19 NOTE — Discharge Instructions (Addendum)
 As we discussed, take Tramadol for pain as instructed. If your nose bleeding continues despite efforts to stop with Afrin and pressure, follow up with ENT as referred.   Return to the ED as needed for any concerning symptoms.

## 2024-04-19 NOTE — ED Triage Notes (Signed)
 Pt reports walking down the steps when she fell and struck her face. Pt denies any blood thinners. Pt denies any LOC. Pt has swelling to L nostril/cheek.

## 2024-04-19 NOTE — Discharge Instructions (Signed)
" °  Reason for visit You were evaluated after a fall onto your face with a nosebleed (epistaxis). Based on your symptoms and exam, there is concern for a possible nasal fracture. Plan You have chosen to go to the Emergency Department (ED) for further evaluation and imaging (CT scan) to assess for a broken nose and any associated facial injury. What to do now Go directly to the ED for evaluation and imaging. Avoid blowing your nose. If bleeding recurs, apply firm pressure by pinching the soft part of the nose for 10-15 minutes while leaning slightly forward. Apply ice to the nose/face (wrapped in cloth) for 15-20 minutes at a time to reduce swelling. Avoid NSAIDs (ibuprofen, naproxen) until cleared, as they may worsen bleeding unless otherwise directed. Go to the ED immediately or tell staff if you have Persistent or heavy nosebleed Worsening pain or swelling Difficulty breathing through the nose Clear fluid draining from the nose Vision changes, severe headache, vomiting, dizziness, or confusion Follow-up Follow ED recommendations after imaging. You may need ENT follow-up depending on results. If you have questions or symptoms worsen before arrival, seek emergency care immediately. "

## 2024-04-26 ENCOUNTER — Ambulatory Visit: Admitting: Podiatry
